# Patient Record
Sex: Female | Born: 1985
Health system: Southern US, Community
[De-identification: ages and names within clinical notes are randomized; demographics above are authoritative.]

## PROBLEM LIST (undated history)

## (undated) ENCOUNTER — Inpatient Hospital Stay (HOSPITAL_COMMUNITY): Payer: Self-pay

## (undated) DIAGNOSIS — R87629 Unspecified abnormal cytological findings in specimens from vagina: Secondary | ICD-10-CM

## (undated) DIAGNOSIS — F419 Anxiety disorder, unspecified: Secondary | ICD-10-CM

## (undated) HISTORY — PX: NO PAST SURGERIES: SHX2092

## (undated) HISTORY — PX: INDUCED ABORTION: SHX677

---
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
## 2005-09-03 ENCOUNTER — Emergency Department (HOSPITAL_COMMUNITY): Admission: EM | Admit: 2005-09-03 | Discharge: 2005-09-03 | Payer: Self-pay | Admitting: Family Medicine

## 2005-09-06 ENCOUNTER — Emergency Department (HOSPITAL_COMMUNITY): Admission: EM | Admit: 2005-09-06 | Discharge: 2005-09-06 | Payer: Self-pay | Admitting: Family Medicine

## 2013-04-30 ENCOUNTER — Other Ambulatory Visit: Payer: Self-pay | Admitting: Certified Nurse Midwife

## 2013-04-30 ENCOUNTER — Inpatient Hospital Stay (HOSPITAL_COMMUNITY)
Admission: AD | Admit: 2013-04-30 | Discharge: 2013-04-30 | Discharge status: Against Medical Advice | Payer: BC Managed Care – PPO | Source: Ambulatory Visit | Attending: Obstetrics | Admitting: Obstetrics

## 2013-04-30 DIAGNOSIS — O039 Complete or unspecified spontaneous abortion without complication: Secondary | ICD-10-CM

## 2013-04-30 MED ORDER — RHO D IMMUNE GLOBULIN 300 MCG IM INJ: 300.0000 ug | INJECTION | Freq: Once | INTRAMUSCULAR | Status: DC

## 2013-04-30 NOTE — ED Notes (Signed)
Pt only for lab work did not need to be on MAU census.

## 2015-01-20 ENCOUNTER — Inpatient Hospital Stay (HOSPITAL_COMMUNITY)
Admission: AD | Admit: 2015-01-20 | Discharge: 2015-01-20 | Disposition: A | Discharge status: Home / Self Care | Payer: Self-pay | Source: Ambulatory Visit | Attending: Family Medicine | Admitting: Family Medicine

## 2015-01-20 ENCOUNTER — Inpatient Hospital Stay (HOSPITAL_COMMUNITY): Payer: Self-pay

## 2015-01-20 ENCOUNTER — Encounter (HOSPITAL_COMMUNITY): Payer: Self-pay | Admitting: *Deleted

## 2015-01-20 DIAGNOSIS — O034 Incomplete spontaneous abortion without complication: Secondary | ICD-10-CM

## 2015-01-20 DIAGNOSIS — O209 Hemorrhage in early pregnancy, unspecified: Secondary | ICD-10-CM

## 2015-01-20 DIAGNOSIS — O039 Complete or unspecified spontaneous abortion without complication: Secondary | ICD-10-CM | POA: Insufficient documentation

## 2015-01-20 DIAGNOSIS — Z3A12 12 weeks gestation of pregnancy: Secondary | ICD-10-CM | POA: Insufficient documentation

## 2015-01-20 HISTORY — DX: Anxiety disorder, unspecified: F41.9

## 2015-01-20 LAB — CBC
HCT: 39.4 % (ref 36.0–46.0)
Hemoglobin: 13.8 g/dL (ref 12.0–15.0)
MCH: 29.9 pg (ref 26.0–34.0)
MCHC: 35 g/dL (ref 30.0–36.0)
MCV: 85.5 fL (ref 78.0–100.0)
Platelets: 359 10*3/uL (ref 150–400)
RBC: 4.61 MIL/uL (ref 3.87–5.11)
RDW: 14.2 % (ref 11.5–15.5)
WBC: 14.9 10*3/uL — ABNORMAL HIGH (ref 4.0–10.5)

## 2015-01-20 LAB — URINE MICROSCOPIC-ADD ON

## 2015-01-20 LAB — URINALYSIS, ROUTINE W REFLEX MICROSCOPIC
Bilirubin Urine: NEGATIVE
GLUCOSE, UA: NEGATIVE mg/dL
Ketones, ur: NEGATIVE mg/dL
Leukocytes, UA: NEGATIVE
Nitrite: NEGATIVE
PH: 6 (ref 5.0–8.0)
Protein, ur: NEGATIVE mg/dL
Specific Gravity, Urine: 1.01 (ref 1.005–1.030)
Urobilinogen, UA: 0.2 mg/dL (ref 0.0–1.0)

## 2015-01-20 LAB — ABO/RH: ABO/RH(D): A POS

## 2015-01-20 LAB — POCT PREGNANCY, URINE: Preg Test, Ur: POSITIVE — AB

## 2015-01-20 LAB — HCG, QUANTITATIVE, PREGNANCY: hCG, Beta Chain, Quant, S: 2464 m[IU]/mL — ABNORMAL HIGH (ref <5)

## 2015-01-20 MED ORDER — NAPROXEN SODIUM 275 MG PO TABS: 275.0000 mg | ORAL_TABLET | Freq: Two times a day (BID) | ORAL | Status: DC

## 2015-01-20 NOTE — MAU Note (Signed)
Thinks she is 12wks preg., thinks she may be having a miscarriage.  Had cramps last night.  Is having some brownish bleeding 2 days ago.  This morning when voided the water was red, since then has seen dark red or brown when she wipes

## 2015-01-20 NOTE — MAU Note (Signed)
Spoke with Madison Bowman CNM patient will be assigned to [facility] not [facility]

## 2015-01-20 NOTE — MAU Provider Note (Signed)
History     CSN: [id number]  Arrival date and time: 01/20/15 1203   First Provider Initiated Contact with Patient 01/20/15 1309      Chief Complaint  Patient presents with  . Threatened Miscarriage   HPI  Madison Bowman 29 y.o. G2P0010 [redacted]w[redacted]d presents  To the MAU stating that she has had some red to brown spotting over the past week. Occasional abdominal crampiness but not all the time.   Past Medical History  Diagnosis Date  . Anxiety     Past Surgical History  Procedure Laterality Date  . No past surgeries      No family history on file.  Social History  Substance Use Topics  . Smoking status: Current Every Day Smoker -- 0.50 packs/day    Types: Cigarettes  . Smokeless tobacco: None  . Alcohol Use: No    Allergies: No Known Allergies  Prescriptions prior to admission  Medication Sig Dispense Refill Last Dose  . OVER THE COUNTER MEDICATION Take 6 tablets by mouth daily. Juice plus herbal supplement   01/19/2015 at Unknown time  . Prenatal Vit-Fe Fumarate-FA (PRENATAL MULTIVITAMIN) TABS tablet Take 1 tablet by mouth at bedtime.   01/19/2015 at Unknown time  . sertraline (ZOLOFT) 100 MG tablet Take 200 mg by mouth daily.   01/19/2015 at Unknown time  . rho,D, immune globulin (RHOGAM ULTRA-FILTERED PLUS) 300 MCG INJ injection Inject 300 mcg into the muscle once. 1 each 0     Review of Systems  Constitutional: Negative for fever.  Gastrointestinal: Positive for abdominal pain.  Genitourinary:       Vag bleeding  All other systems reviewed and are negative.  Physical Exam   Blood pressure 116/84, pulse 88, temperature 99.2 F (37.3 C), temperature source Oral, resp. rate 16, weight 65.499 kg (144 lb 6.4 oz), last menstrual period 10/28/2014, unknown if currently breastfeeding.  Physical Exam  Nursing note and vitals reviewed. Constitutional: She is oriented to person, place, and time. She appears well-developed and well-nourished. No distress.  HENT:  Head:  Normocephalic and atraumatic.  Neck: Normal range of motion.  Cardiovascular: Normal rate.   Respiratory: Effort normal. No respiratory distress.  GI: Soft. There is no tenderness.  Musculoskeletal: Normal range of motion.  Neurological: She is alert and oriented to person, place, and time.  Skin: Skin is warm and dry.  Psychiatric: She has a normal mood and affect. Her behavior is normal. Judgment and thought content normal.   Results for orders placed or performed during the hospital encounter of 01/20/15 (from the past 24 hour(s))  Urinalysis, Routine w reflex microscopic (not at [facility])     Status: Abnormal   Collection Time: 01/20/15 12:35 PM  Result Value Ref Range   Color, Urine YELLOW YELLOW   APPearance CLEAR CLEAR   Specific Gravity, Urine 1.010 1.005 - 1.030   pH 6.0 5.0 - 8.0   Glucose, UA NEGATIVE NEGATIVE mg/dL   Hgb urine dipstick MODERATE (A) NEGATIVE   Bilirubin Urine NEGATIVE NEGATIVE   Ketones, ur NEGATIVE NEGATIVE mg/dL   Protein, ur NEGATIVE NEGATIVE mg/dL   Urobilinogen, UA 0.2 0.0 - 1.0 mg/dL   Nitrite NEGATIVE NEGATIVE   Leukocytes, UA NEGATIVE NEGATIVE  Urine microscopic-add on     Status: None   Collection Time: 01/20/15 12:35 PM  Result Value Ref Range   Squamous Epithelial / LPF RARE RARE   WBC, UA 0-2 <3 WBC/hpf   RBC / HPF 3-6 <3 RBC/hpf   Bacteria,  UA RARE RARE  CBC     Status: Abnormal   Collection Time: 01/20/15 12:58 PM  Result Value Ref Range   WBC 14.9 (H) 4.0 - 10.5 K/uL   RBC 4.61 3.87 - 5.11 MIL/uL   Hemoglobin 13.8 12.0 - 15.0 g/dL   HCT 16.1 09.6 - 04.5 %   MCV 85.5 78.0 - 100.0 fL   MCH 29.9 26.0 - 34.0 pg   MCHC 35.0 30.0 - 36.0 g/dL   RDW 40.9 81.1 - 91.4 %   Platelets 359 150 - 400 K/uL  Pregnancy, urine POC     Status: Abnormal   Collection Time: 01/20/15  1:02 PM  Result Value Ref Range   Preg Test, Ur POSITIVE (A) NEGATIVE  hCG, quantitative, pregnancy     Status: Abnormal   Collection Time: 01/20/15  2:13 PM  Result  Value Ref Range   hCG, Beta Chain, Quant, S 2464 (H) <5 mIU/mL  ABO/Rh     Status: None   Collection Time: 01/20/15  2:13 PM  Result Value Ref Range   ABO/RH(D) A POS   US Ob Comp Less 14 Wks  01/20/2015   CLINICAL DATA:  First trimester pregnancy, vaginal bleeding.  EXAM: OBSTETRIC <14 WK [place] AND TRANSVAGINAL OB US  TECHNIQUE: Both transabdominal and transvaginal ultrasound examinations were performed for complete evaluation of the gestation as well as the maternal uterus, adnexal regions, and pelvic cul-de-sac. Transvaginal technique was performed to assess early pregnancy.  COMPARISON:  None.  FINDINGS: Intrauterine gestational sac: Visualized.  Yolk sac:  Visualized.  Embryo:  Visualized.  Cardiac Activity: Not visualized.  CRL:  21.3  mm   8 w   5 d                  [place] EDC: August 27, 2015.  Maternal uterus/adnexae: Ovaries appear normal. No free fluid is noted.  IMPRESSION: Findings meet definitive criteria for failed pregnancy. This follows SRU consensus guidelines: Diagnostic Criteria for Nonviable Pregnancy Early in the First Trimester. [facility] Mis J Med [phone].   Electronically Signed   By: Lupita Raider, M.D.   On: 01/20/2015 15:30   US Ob Transvaginal  01/20/2015   CLINICAL DATA:  First trimester pregnancy, vaginal bleeding.  EXAM: OBSTETRIC <14 WK [place] AND TRANSVAGINAL OB US  TECHNIQUE: Both transabdominal and transvaginal ultrasound examinations were performed for complete evaluation of the gestation as well as the maternal uterus, adnexal regions, and pelvic cul-de-sac. Transvaginal technique was performed to assess early pregnancy.  COMPARISON:  None.  FINDINGS: Intrauterine gestational sac: Visualized.  Yolk sac:  Visualized.  Embryo:  Visualized.  Cardiac Activity: Not visualized.  CRL:  21.3  mm   8 w   5 d                  [place] EDC: August 27, 2015.  Maternal uterus/adnexae: Ovaries appear normal. No free fluid is noted.  IMPRESSION: Findings meet definitive criteria for failed  pregnancy. This follows SRU consensus guidelines: Diagnostic Criteria for Nonviable Pregnancy Early in the First Trimester. Macy Mis J Med [id number].   Electronically Signed   By: Lupita Raider, M.D.   On: 01/20/2015 15:30   MAU Course  Procedures  MDM CBC, U/S, Beta , ABO- Discussed failed pregnancy with pt and she chooses to proceed with expectant management. She states that she is already cramping some and she thinks she can mange her pain with Anaprox. Pt will be scheduled to f/u  in clinic on 01/26/15.  Assessment and Plan  Failed Pregnancy Anaprox DS Miscarriage/ Bleeding Precautions Discharge to home    Clemmons,Lori Grissett 01/20/2015, 2:28 PM

## 2015-01-20 NOTE — Discharge Instructions (Signed)
Miscarriage °A miscarriage is the loss of an unborn baby (fetus) before the 20th week of pregnancy. The cause is often unknown.  °HOME CARE °· You may need to stay in bed (bed rest), or you may be able to do light activity. Go about activity as told by your doctor. °· Have help at home. °· Write down how many pads you use each day. Write down how soaked they are. °· Do not use tampons. Do not wash out your vagina (douche) or have sex (intercourse) until your doctor approves. °· Only take medicine as told by your doctor. °· Do not take aspirin. °· Keep all doctor visits as told. °· If you or your partner have problems with grieving, talk to your doctor. You can also try counseling. Give yourself time to grieve before trying to get pregnant again. °GET HELP RIGHT AWAY IF: °· You have bad cramps or pain in your back or belly (abdomen). °· You have a fever. °· You pass large clumps of blood (clots) from your vagina that are walnut-sized or larger. Save the clumps for your doctor to see. °· You pass large amounts of tissue from your vagina. Save the tissue for your doctor to see. °· You have more bleeding. °· You have thick, bad-smelling fluid (discharge) coming from the vagina. °· You get lightheaded, weak, or you pass out (faint). °· You have chills. °MAKE SURE YOU: °· Understand these instructions. °· Will watch your condition. °· Will get help right away if you are not doing well or get worse. °Document Released: 07/08/2011 Document Reviewed: 07/08/2011 °ExitCare® Patient Information ©2015 [employer]. This information is not intended to replace advice given to you by your health care provider. Make sure you discuss any questions you have with your health care provider. ° °

## 2015-01-20 NOTE — MAU Note (Signed)
Called ultrasound inquiring how much longer for patient to go will probably be another 20 minutes, informed patient.

## 2015-01-26 ENCOUNTER — Encounter: Payer: Self-pay | Admitting: Medical

## 2015-01-26 ENCOUNTER — Ambulatory Visit (INDEPENDENT_AMBULATORY_CARE_PROVIDER_SITE_OTHER): Payer: Self-pay | Admitting: Medical

## 2015-01-26 ENCOUNTER — Inpatient Hospital Stay (HOSPITAL_COMMUNITY)
Admission: AD | Admit: 2015-01-26 | Discharge: 2015-01-26 | Disposition: A | Discharge status: Home / Self Care | Payer: Self-pay | Source: Ambulatory Visit | Attending: Obstetrics & Gynecology | Admitting: Obstetrics & Gynecology

## 2015-01-26 VITALS — BP 104/71 | HR 71 | Temp 98.4°F | Ht 61.0 in | Wt 142.3 lb

## 2015-01-26 DIAGNOSIS — O039 Complete or unspecified spontaneous abortion without complication: Secondary | ICD-10-CM

## 2015-01-26 DIAGNOSIS — Z3A08 8 weeks gestation of pregnancy: Secondary | ICD-10-CM | POA: Insufficient documentation

## 2015-01-26 DIAGNOSIS — O209 Hemorrhage in early pregnancy, unspecified: Secondary | ICD-10-CM | POA: Insufficient documentation

## 2015-01-26 LAB — CBC
HEMATOCRIT: 39.4 % (ref 36.0–46.0)
Hemoglobin: 13.3 g/dL (ref 12.0–15.0)
MCH: 29.4 pg (ref 26.0–34.0)
MCHC: 33.8 g/dL (ref 30.0–36.0)
MCV: 87.2 fL (ref 78.0–100.0)
MPV: 10.1 fL (ref 8.6–12.4)
PLATELETS: 434 10*3/uL — AB (ref 150–400)
RBC: 4.52 MIL/uL (ref 3.87–5.11)
RDW: 14.1 % (ref 11.5–15.5)
WBC: 12.5 10*3/uL — AB (ref 4.0–10.5)

## 2015-01-26 NOTE — Patient Instructions (Signed)
Miscarriage °A miscarriage is the loss of an unborn baby (fetus) before the 20th week of pregnancy. The cause is often unknown.  °HOME CARE °· You may need to stay in bed (bed rest), or you may be able to do light activity. Go about activity as told by your doctor. °· Have help at home. °· Write down how many pads you use each day. Write down how soaked they are. °· Do not use tampons. Do not wash out your vagina (douche) or have sex (intercourse) until your doctor approves. °· Only take medicine as told by your doctor. °· Do not take aspirin. °· Keep all doctor visits as told. °· If you or your partner have problems with grieving, talk to your doctor. You can also try counseling. Give yourself time to grieve before trying to get pregnant again. °GET HELP RIGHT AWAY IF: °· You have bad cramps or pain in your back or belly (abdomen). °· You have a fever. °· You pass large clumps of blood (clots) from your vagina that are walnut-sized or larger. Save the clumps for your doctor to see. °· You pass large amounts of tissue from your vagina. Save the tissue for your doctor to see. °· You have more bleeding. °· You have thick, bad-smelling fluid (discharge) coming from the vagina. °· You get lightheaded, weak, or you pass out (faint). °· You have chills. °MAKE SURE YOU: °· Understand these instructions. °· Will watch your condition. °· Will get help right away if you are not doing well or get worse. °Document Released: 07/08/2011 Document Reviewed: 07/08/2011 °ExitCare® Patient Information ©2015 [employer]. This information is not intended to replace advice given to you by your health care Guida Asman. Make sure you discuss any questions you have with your health care Nassim Cosma. ° °

## 2015-01-26 NOTE — Progress Notes (Signed)
Patient ID: [name], female   DOB: 08-Oct-1985, 29 y.o.   MRN: [id number]  History:  Ms. Madison Bowman is a 29 y.o. G2P0020 who presents to clinic today for follow-up after missed AB. The patient was diagnosed with missed AB at [redacted]w[redacted]d last Friday. She had decided on expectant management at this time. She states that over last weekend she had heavy bleeding with severe cramping and passed clots and tissue. She states that bleeding now is much lighter and the pain is mild and intermittent. She is not taking any pain medication. She did try Anaprox initially for pain without relief. This is the patient's second miscarriage around the same GA. She denies fever.     There are no active problems to display for this patient.   No Known Allergies  No current outpatient prescriptions on file prior to visit.   No current facility-administered medications on file prior to visit.     The following portions of the patient's history were reviewed and updated as appropriate: allergies, current medications, family history, past medical history, social history, past surgical history and problem list.  Review of Systems:  Other than those mentioned in HPI all ROS negative  Objective:  Physical Exam BP 104/71 mmHg  Pulse 71  Temp(Src) 98.4 F (36.9 C) (Oral)  Ht  (1.549 m)  Wt 142 lb 4.8 oz (64.547 kg)  BMI 26.90 kg/m2  LMP 10/28/2014 (Exact Date)  Breastfeeding? Unknown CONSTITUTIONAL: Well-developed, well-nourished female in no acute distress.  EYES: EOM intact, conjunctivae normal, no scleral icterus HEAD: Normocephalic, atraumatic ENT: External right and left ear normal, oropharynx is clear and moist. CARDIOVASCULAR: Normal heart rate noted RESPIRATORY: Effort normal, no problems with respiration noted. GASTROINTESTINAL:Soft, no distention noted.   MUSCULOSKELETAL: Normal range of motion.  SKIN: Skin is warm and dry. No rash noted. Not diaphoretic. No erythema. No pallor. NEUROLGIC:  Alert and oriented to person, place, and time. Normal muscle tone, coordination.  PSYCHIATRIC: Normal mood and affect. Normal behavior. Normal judgment and thought content.  Labs and Imaging Quant hCG and CBC today  Assessment & Plan:  Assessment: SAB  Plans: Patient will be contacted with hCG results and may need to return for follow-up if hCG is still elevated Patient may desire further testing for habitual abortions even though this is her second SAB. She will discuss with her family and return for labs if desired Patient to return to [facility] as needed or if symptoms were to change or worsen  Marny Lowenstein, PA-C 01/26/2015 1:51 PM

## 2015-01-27 LAB — HCG, QUANTITATIVE, PREGNANCY: hCG, Beta Chain, Quant, S: 85.8 m[IU]/mL

## 2015-01-30 ENCOUNTER — Telehealth: Payer: Self-pay | Admitting: *Deleted

## 2015-01-30 NOTE — Telephone Encounter (Signed)
Pt called into the clinic, results given.  Pt states she can come Thursday 02/02/15 @ 1100 , message sent to front office.

## 2015-01-30 NOTE — Telephone Encounter (Signed)
Attempted to contact patient with BHCG results and recommendation to return to clinic on Thurs. For repeat lab, no answer, left message for patient to return call to clinic for results.

## 2015-02-02 ENCOUNTER — Other Ambulatory Visit: Payer: Self-pay

## 2015-02-02 DIAGNOSIS — O021 Missed abortion: Secondary | ICD-10-CM

## 2015-02-03 LAB — HCG, QUANTITATIVE, PREGNANCY: hCG, Beta Chain, Quant, S: 16.9 m[IU]/mL

## 2015-05-25 ENCOUNTER — Ambulatory Visit (INDEPENDENT_AMBULATORY_CARE_PROVIDER_SITE_OTHER): Payer: BLUE CROSS/BLUE SHIELD | Admitting: Medical

## 2015-05-25 ENCOUNTER — Encounter: Payer: Self-pay | Admitting: Medical

## 2015-05-25 ENCOUNTER — Telehealth: Payer: Self-pay | Admitting: General Practice

## 2015-05-25 DIAGNOSIS — Z36 Encounter for antenatal screening of mother: Secondary | ICD-10-CM | POA: Diagnosis not present

## 2015-05-25 DIAGNOSIS — O3680X0 Pregnancy with inconclusive fetal viability, not applicable or unspecified: Secondary | ICD-10-CM | POA: Diagnosis not present

## 2015-05-25 DIAGNOSIS — Z3201 Encounter for pregnancy test, result positive: Secondary | ICD-10-CM

## 2015-05-25 DIAGNOSIS — O3680X1 Pregnancy with inconclusive fetal viability, fetus 1: Secondary | ICD-10-CM

## 2015-05-25 LAB — POCT PREGNANCY, URINE: PREG TEST UR: POSITIVE — AB

## 2015-05-25 NOTE — Telephone Encounter (Signed)
Received call from ultrasound that time slot was not able for previously scheduled ultrasound & appt was moved to 2/2 @ 11am. Called patient to informed her. Patient states she cannot do that time but could earlier. Rescheduled ultrasound for 2/2 @ 8am. Patient informed. Patient verbalized understanding & had no questions

## 2015-05-25 NOTE — Progress Notes (Signed)
Bedside US for viability and EGA.  Single IUP, FHR - 183 bpm per M-mode, CRL - 2.22 cm (8w 6d).

## 2015-05-25 NOTE — Progress Notes (Signed)
Patient here today to have quant checked. Patient states she has had multiple miscarriages in a row and was told she would need follow up at her next pregnancy because she always loses babies at 8 weeks. Patient reports positive home test last week or the week before. Patient unsure of LMP. States she had a period sometime in November but it was not like a normal period so she doesn't know if that is what it was or not. Upt positive in office today. Diane Day to scan patient. Vonzella Nipple informed. Clinic ultrasound reveals [redacted]w[redacted]d by crl with edd 12/29/15. Dating ultrasound scheduled for 2/1 @ 1130. Patient informed and encouraged her to make new OB for 4 weeks from now. Patient verbalized understanding to all & had no questions

## 2015-05-25 NOTE — Patient Instructions (Signed)

## 2015-05-25 NOTE — Progress Notes (Signed)
Patient ID: [name], female   DOB: 1985/11/12, 30 y.o.   MRN: [id number]  Patient not seen today. See RN note for more information.   Marny Lowenstein, PA-C 05/25/2015 3:32 PM

## 2015-05-25 NOTE — Addendum Note (Signed)
Addended by: Jill Side on: 05/25/2015 03:37 PM   Modules accepted: Orders

## 2015-05-31 ENCOUNTER — Ambulatory Visit (HOSPITAL_COMMUNITY): Payer: BLUE CROSS/BLUE SHIELD

## 2015-06-01 ENCOUNTER — Ambulatory Visit (INDEPENDENT_AMBULATORY_CARE_PROVIDER_SITE_OTHER): Payer: BLUE CROSS/BLUE SHIELD | Admitting: Obstetrics & Gynecology

## 2015-06-01 ENCOUNTER — Ambulatory Visit (HOSPITAL_COMMUNITY): Payer: BLUE CROSS/BLUE SHIELD

## 2015-06-01 ENCOUNTER — Encounter: Payer: BLUE CROSS/BLUE SHIELD | Admitting: Obstetrics & Gynecology

## 2015-06-01 ENCOUNTER — Other Ambulatory Visit: Payer: Self-pay | Admitting: General Practice

## 2015-06-01 ENCOUNTER — Ambulatory Visit (HOSPITAL_COMMUNITY)
Admission: RE | Admit: 2015-06-01 | Discharge: 2015-06-01 | Disposition: A | Discharge status: Home / Self Care | Payer: BLUE CROSS/BLUE SHIELD | Source: Ambulatory Visit | Attending: Medical | Admitting: Medical

## 2015-06-01 DIAGNOSIS — Z36 Encounter for antenatal screening of mother: Secondary | ICD-10-CM | POA: Diagnosis not present

## 2015-06-01 DIAGNOSIS — O0990 Supervision of high risk pregnancy, unspecified, unspecified trimester: Secondary | ICD-10-CM | POA: Insufficient documentation

## 2015-06-01 DIAGNOSIS — O2621 Pregnancy care for patient with recurrent pregnancy loss, first trimester: Secondary | ICD-10-CM

## 2015-06-01 DIAGNOSIS — N96 Recurrent pregnancy loss: Secondary | ICD-10-CM

## 2015-06-01 DIAGNOSIS — Z3201 Encounter for pregnancy test, result positive: Secondary | ICD-10-CM

## 2015-06-01 DIAGNOSIS — Z3A10 10 weeks gestation of pregnancy: Secondary | ICD-10-CM | POA: Insufficient documentation

## 2015-06-01 DIAGNOSIS — Z3491 Encounter for supervision of normal pregnancy, unspecified, first trimester: Secondary | ICD-10-CM

## 2015-06-01 NOTE — Patient Instructions (Signed)
First Trimester of Pregnancy The first trimester of pregnancy is from week 1 until the end of week 12 (months 1 through 3). A week after a sperm fertilizes an egg, the egg will implant on the wall of the uterus. This embryo will begin to develop into a baby. Genes from you and your partner are forming the baby. The female genes determine whether the baby is a boy or a girl. At 6-8 weeks, the eyes and face are formed, and the heartbeat can be seen on ultrasound. At the end of 12 weeks, all the baby's organs are formed.  Now that you are pregnant, you will want to do everything you can to have a healthy baby. Two of the most important things are to get good prenatal care and to follow your health care provider's instructions. Prenatal care is all the medical care you receive before the baby's birth. This care will help prevent, find, and treat any problems during the pregnancy and childbirth. BODY CHANGES Your body goes through many changes during pregnancy. The changes vary from woman to woman.   You may gain or lose a couple of pounds at first.  You may feel sick to your stomach (nauseous) and throw up (vomit). If the vomiting is uncontrollable, call your health care provider.  You may tire easily.  You may develop headaches that can be relieved by medicines approved by your health care provider.  You may urinate more often. Painful urination may mean you have a bladder infection.  You may develop heartburn as a result of your pregnancy.  You may develop constipation because certain hormones are causing the muscles that push waste through your intestines to slow down.  You may develop hemorrhoids or swollen, bulging veins (varicose veins).  Your breasts may begin to grow larger and become tender. Your nipples may stick out more, and the tissue that surrounds them (areola) may become darker.  Your gums may bleed and may be sensitive to brushing and flossing.  Dark spots or blotches (chloasma,  mask of pregnancy) may develop on your face. This will likely fade after the baby is born.  Your menstrual periods will stop.  You may have a loss of appetite.  You may develop cravings for certain kinds of food.  You may have changes in your emotions from day to day, such as being excited to be pregnant or being concerned that something may go wrong with the pregnancy and baby.  You may have more vivid and strange dreams.  You may have changes in your hair. These can include thickening of your hair, rapid growth, and changes in texture. Some women also have hair loss during or after pregnancy, or hair that feels dry or thin. Your hair will most likely return to normal after your baby is born. WHAT TO EXPECT AT YOUR PRENATAL VISITS During a routine prenatal visit:  You will be weighed to make sure you and the baby are growing normally.  Your blood pressure will be taken.  Your abdomen will be measured to track your baby's growth.  The fetal heartbeat will be listened to starting around week 10 or 12 of your pregnancy.  Test results from any previous visits will be discussed. Your health care provider may ask you:  How you are feeling.  If you are feeling the baby move.  If you have had any abnormal symptoms, such as leaking fluid, bleeding, severe headaches, or abdominal cramping.  If you are using any tobacco products,   including cigarettes, chewing tobacco, and electronic cigarettes.  If you have any questions. Other tests that may be performed during your first trimester include:  Blood tests to find your blood type and to check for the presence of any previous infections. They will also be used to check for low iron levels (anemia) and Rh antibodies. Later in the pregnancy, blood tests for diabetes will be done along with other tests if problems develop.  Urine tests to check for infections, diabetes, or protein in the urine.  An ultrasound to confirm the proper growth  and development of the baby.  An amniocentesis to check for possible genetic problems.  Fetal screens for spina bifida and Down syndrome.  You may need other tests to make sure you and the baby are doing well.  HIV (human immunodeficiency virus) testing. Routine prenatal testing includes screening for HIV, unless you choose not to have this test. HOME CARE INSTRUCTIONS  Medicines  Follow your health care provider's instructions regarding medicine use. Specific medicines may be either safe or unsafe to take during pregnancy.  Take your prenatal vitamins as directed.  If you develop constipation, try taking a stool softener if your health care provider approves. Diet  Eat regular, well-balanced meals. Choose a variety of foods, such as meat or vegetable-based protein, fish, milk and low-fat dairy products, vegetables, fruits, and whole grain breads and cereals. Your health care provider will help you determine the amount of weight gain that is right for you.  Avoid raw meat and uncooked cheese. These carry germs that can cause birth defects in the baby.  Eating four or five small meals rather than three large meals a day may help relieve nausea and vomiting. If you start to feel nauseous, eating a few soda crackers can be helpful. Drinking liquids between meals instead of during meals also seems to help nausea and vomiting.  If you develop constipation, eat more high-fiber foods, such as fresh vegetables or fruit and whole grains. Drink enough fluids to keep your urine clear or pale yellow. Activity and Exercise  Exercise only as directed by your health care provider. Exercising will help you:  Control your weight.  Stay in shape.  Be prepared for labor and delivery.  Experiencing pain or cramping in the lower abdomen or low back is a good sign that you should stop exercising. Check with your health care provider before continuing normal exercises.  Try to avoid standing for long  periods of time. Move your legs often if you must stand in one place for a long time.  Avoid heavy lifting.  Wear low-heeled shoes, and practice good posture.  You may continue to have sex unless your health care provider directs you otherwise. Relief of Pain or Discomfort  Wear a good support bra for breast tenderness.   Take warm sitz baths to soothe any pain or discomfort caused by hemorrhoids. Use hemorrhoid cream if your health care provider approves.   Rest with your legs elevated if you have leg cramps or low back pain.  If you develop varicose veins in your legs, wear support hose. Elevate your feet for 15 minutes, 3-4 times a day. Limit salt in your diet. Prenatal Care  Schedule your prenatal visits by the twelfth week of pregnancy. They are usually scheduled monthly at first, then more often in the last 2 months before delivery.  Write down your questions. Take them to your prenatal visits.  Keep all your prenatal visits as directed by your   health care provider. Safety  Wear your seat belt at all times when driving.  Make a list of emergency phone numbers, including numbers for family, friends, the hospital, and police and fire departments. General Tips  Ask your health care provider for a referral to a local prenatal education class. Begin classes no later than at the beginning of month 6 of your pregnancy.  Ask for help if you have counseling or nutritional needs during pregnancy. Your health care provider can offer advice or refer you to specialists for help with various needs.  Do not use hot tubs, steam rooms, or saunas.  Do not douche or use tampons or scented sanitary pads.  Do not cross your legs for long periods of time.  Avoid cat litter boxes and soil used by cats. These carry germs that can cause birth defects in the baby and possibly loss of the fetus by miscarriage or stillbirth.  Avoid all smoking, herbs, alcohol, and medicines not prescribed by  your health care provider. Chemicals in these affect the formation and growth of the baby.  Do not use any tobacco products, including cigarettes, chewing tobacco, and electronic cigarettes. If you need help quitting, ask your health care provider. You may receive counseling support and other resources to help you quit.  Schedule a dentist appointment. At home, brush your teeth with a soft toothbrush and be gentle when you floss. SEEK MEDICAL CARE IF:   You have dizziness.  You have mild pelvic cramps, pelvic pressure, or nagging pain in the abdominal area.  You have persistent nausea, vomiting, or diarrhea.  You have a bad smelling vaginal discharge.  You have pain with urination.  You notice increased swelling in your face, hands, legs, or ankles. SEEK IMMEDIATE MEDICAL CARE IF:   You have a fever.  You are leaking fluid from your vagina.  You have spotting or bleeding from your vagina.  You have severe abdominal cramping or pain.  You have rapid weight gain or loss.  You vomit blood or material that looks like coffee grounds.  You are exposed to German measles and have never had them.  You are exposed to fifth disease or chickenpox.  You develop a severe headache.  You have shortness of breath.  You have any kind of trauma, such as from a fall or a car accident.   This information is not intended to replace advice given to you by your health care provider. Make sure you discuss any questions you have with your health care provider.   Document Released: 04/09/2001 Document Revised: 05/06/2014 Document Reviewed: 02/23/2013 Elsevier Interactive Patient Education 2016 [employer]  

## 2015-06-19 ENCOUNTER — Ambulatory Visit (HOSPITAL_COMMUNITY)
Admission: RE | Admit: 2015-06-19 | Discharge: 2015-06-19 | Disposition: A | Discharge status: Home / Self Care | Payer: BLUE CROSS/BLUE SHIELD | Source: Ambulatory Visit | Attending: Obstetrics & Gynecology | Admitting: Obstetrics & Gynecology

## 2015-06-19 ENCOUNTER — Other Ambulatory Visit: Payer: Self-pay | Admitting: Obstetrics & Gynecology

## 2015-06-19 ENCOUNTER — Encounter (HOSPITAL_COMMUNITY): Payer: Self-pay

## 2015-06-19 DIAGNOSIS — Z369 Encounter for antenatal screening, unspecified: Secondary | ICD-10-CM

## 2015-06-19 DIAGNOSIS — Z3491 Encounter for supervision of normal pregnancy, unspecified, first trimester: Secondary | ICD-10-CM

## 2015-06-19 DIAGNOSIS — Z3A12 12 weeks gestation of pregnancy: Secondary | ICD-10-CM

## 2015-06-19 DIAGNOSIS — Z36 Encounter for antenatal screening of mother: Secondary | ICD-10-CM | POA: Diagnosis not present

## 2015-06-21 ENCOUNTER — Encounter: Payer: BLUE CROSS/BLUE SHIELD | Admitting: Advanced Practice Midwife

## 2015-06-22 ENCOUNTER — Encounter: Payer: BLUE CROSS/BLUE SHIELD | Admitting: Obstetrics & Gynecology

## 2015-06-27 ENCOUNTER — Encounter: Payer: Self-pay | Admitting: *Deleted

## 2015-06-27 DIAGNOSIS — Z3490 Encounter for supervision of normal pregnancy, unspecified, unspecified trimester: Secondary | ICD-10-CM

## 2015-06-28 ENCOUNTER — Other Ambulatory Visit (HOSPITAL_COMMUNITY): Payer: Self-pay

## 2015-07-12 ENCOUNTER — Encounter: Payer: Self-pay | Admitting: Advanced Practice Midwife

## 2015-07-12 ENCOUNTER — Ambulatory Visit (INDEPENDENT_AMBULATORY_CARE_PROVIDER_SITE_OTHER): Payer: BLUE CROSS/BLUE SHIELD | Admitting: Advanced Practice Midwife

## 2015-07-12 VITALS — BP 115/61 | HR 63 | Temp 98.2°F | Wt 146.9 lb

## 2015-07-12 DIAGNOSIS — Z124 Encounter for screening for malignant neoplasm of cervix: Secondary | ICD-10-CM | POA: Diagnosis not present

## 2015-07-12 DIAGNOSIS — Z3491 Encounter for supervision of normal pregnancy, unspecified, first trimester: Secondary | ICD-10-CM

## 2015-07-12 DIAGNOSIS — Z113 Encounter for screening for infections with a predominantly sexual mode of transmission: Secondary | ICD-10-CM

## 2015-07-12 DIAGNOSIS — Z1389 Encounter for screening for other disorder: Secondary | ICD-10-CM

## 2015-07-12 DIAGNOSIS — F17200 Nicotine dependence, unspecified, uncomplicated: Secondary | ICD-10-CM | POA: Insufficient documentation

## 2015-07-12 DIAGNOSIS — O99331 Smoking (tobacco) complicating pregnancy, first trimester: Secondary | ICD-10-CM

## 2015-07-12 LAB — POCT URINALYSIS DIP (DEVICE)
BILIRUBIN URINE: NEGATIVE
GLUCOSE, UA: NEGATIVE mg/dL
HGB URINE DIPSTICK: NEGATIVE
Ketones, ur: NEGATIVE mg/dL
LEUKOCYTES UA: NEGATIVE
NITRITE: NEGATIVE
Protein, ur: NEGATIVE mg/dL
Specific Gravity, Urine: 1.01 (ref 1.005–1.030)
UROBILINOGEN UA: 0.2 mg/dL (ref 0.0–1.0)
pH: 5.5 (ref 5.0–8.0)

## 2015-07-12 MED ORDER — CONCEPT OB 130-92.4-1 MG PO CAPS: 1.0000 | ORAL_CAPSULE | Freq: Every day | ORAL | Status: DC

## 2015-07-12 NOTE — Progress Notes (Signed)
   Subjective:    Madison Bowman is a G4P0030 [id number] by  10 weeks US being seen today for her first obstetrical visit.  Her obstetrical history is significant for smoker. Patient does intend to breast feed. Pregnancy history fully reviewed.  Patient reports no complaints.  Filed Vitals:   07/12/15 1010  BP: 115/61  Pulse: 63  Temp: 98.2 F (36.8 C)  Weight: 146 lb 14.4 oz (66.633 kg)    HISTORY: OB History  Gravida Para Term Preterm AB SAB TAB Ectopic Multiple Living  4 0 0 0 3 2 1 0 0 0     # Outcome Date GA Lbr Len/2nd Weight Sex Delivery Anes PTL Lv  4 Current           3 SAB 2016          2 SAB 2015          1 TAB 2011            Obstetric Comments  Patient had TAB with Cytotec at age 30 years and her family/husband is unaware of this history    Past Medical History  Diagnosis Date  . Anxiety    Past Surgical History  Procedure Laterality Date  . No past surgeries     Family History  Problem Relation Age of Onset  . Thyroid disease Mother   . Depression Maternal Grandfather   . Heart disease Paternal Grandfather      Exam    Uterus: 16 week size  Pelvic Exam:    Perineum: No Hemorrhoids, Hemorrhoids, Normal Perineum   Vulva: normal   Vagina:  normal mucosa, normal discharge   pH: NA   Cervix: no cervical motion tenderness, nulliparous appearance and Scant bleeding after Pap   Adnexa: normal adnexa and no mass, fullness, tenderness   Bony Pelvis: average  System: Breast:  normal appearance, no masses or tenderness, No nipple retraction or dimpling, No nipple discharge or bleeding, No axillary or supraclavicular adenopathy   Skin: normal coloration and turgor, no rashes    Neurologic: oriented, normal mood, grossly non-focal   Extremities: normal strength, tone, and muscle mass   HEENT sclera clear, anicteric   Mouth/Teeth mucous membranes moist, pharynx normal without lesions and dental hygiene good   Neck supple and no masses   Cardiovascular: regular  rate and rhythm   Respiratory:  appears well, vitals normal, no respiratory distress, acyanotic, normal RR, chest clear, no wheezing, crepitations, rhonchi, normal symmetric air entry   Abdomen: soft, non-tender; bowel sounds normal; no masses,  no organomegaly   Urinary: urethral meatus normal      Assessment:    Pregnancy: G4P0030 1. Supervision of low-risk pregnancy, first trimester  - US MFM OB COMP + 14 WK; Future - Prenatal Profile - Cystic fibrosis diagnostic study - Cytology - PAP - GC[facility]Chlamydia probe amp [facility] - Prescript Monitor Profile(19) - Culture, OB Urine - Prenat w/o A Vit-FeFum-FePo-FA (CONCEPT OB) 130-92.4-1 MG CAPS; Take 1 tablet by mouth daily.  Dispense: 30 capsule; Refill: 12 - AFP  2. Encounter for routine screening for malformation using ultrasonics  - US MFM OB COMP + 14 WK; Future    Plan:     Initial labs drawn. Prenatal vitamins. Problem list reviewed and updated. Genetic Screening discussed First Screen: results reviewed. Ultrasound discussed; fetal survey: ordered. Follow up in 4 weeks.   Dorathy KinsmanSMITH, Dvon Jiles 07/12/2015

## 2015-07-12 NOTE — Patient Instructions (Addendum)
Second Trimester of Pregnancy The second trimester is from week 13 through week 28, months 4 through 6. The second trimester is often a time when you feel your best. Your body has also adjusted to being pregnant, and you begin to feel better physically. Usually, morning sickness has lessened or quit completely, you may have more energy, and you may have an increase in appetite. The second trimester is also a time when the fetus is growing rapidly. At the end of the sixth month, the fetus is about 9 inches long and weighs about 1 pounds. You will likely begin to feel the baby move (quickening) between 18 and 20 weeks of the pregnancy. BODY CHANGES Your body goes through many changes during pregnancy. The changes vary from woman to woman.   Your weight will continue to increase. You will notice your lower abdomen bulging out.  You may begin to get stretch marks on your hips, abdomen, and breasts.  You may develop headaches that can be relieved by medicines approved by your health care provider.  You may urinate more often because the fetus is pressing on your bladder.  You may develop or continue to have heartburn as a result of your pregnancy.  You may develop constipation because certain hormones are causing the muscles that push waste through your intestines to slow down.  You may develop hemorrhoids or swollen, bulging veins (varicose veins).  You may have back pain because of the weight gain and pregnancy hormones relaxing your joints between the bones in your pelvis and as a result of a shift in weight and the muscles that support your balance.  Your breasts will continue to grow and be tender.  Your gums may bleed and may be sensitive to brushing and flossing.  Dark spots or blotches (chloasma, mask of pregnancy) may develop on your face. This will likely fade after the baby is born.  A dark line from your belly button to the pubic area (linea nigra) may appear. This will likely fade  after the baby is born.  You may have changes in your hair. These can include thickening of your hair, rapid growth, and changes in texture. Some women also have hair loss during or after pregnancy, or hair that feels dry or thin. Your hair will most likely return to normal after your baby is born. WHAT TO EXPECT AT YOUR PRENATAL VISITS During a routine prenatal visit:  You will be weighed to make sure you and the fetus are growing normally.  Your blood pressure will be taken.  Your abdomen will be measured to track your baby's growth.  The fetal heartbeat will be listened to.  Any test results from the previous visit will be discussed. Your health care provider may ask you:  How you are feeling.  If you are feeling the baby move.  If you have had any abnormal symptoms, such as leaking fluid, bleeding, severe headaches, or abdominal cramping.  If you are using any tobacco products, including cigarettes, chewing tobacco, and electronic cigarettes.  If you have any questions. Other tests that may be performed during your second trimester include:  Blood tests that check for:  Low iron levels (anemia).  Gestational diabetes (between 24 and 28 weeks).  Rh antibodies.  Urine tests to check for infections, diabetes, or protein in the urine.  An ultrasound to confirm the proper growth and development of the baby.  An amniocentesis to check for possible genetic problems.  Fetal screens for spina bifida   and Down syndrome.  HIV (human immunodeficiency virus) testing. Routine prenatal testing includes screening for HIV, unless you choose not to have this test. HOME CARE INSTRUCTIONS   Avoid all smoking, herbs, alcohol, and unprescribed drugs. These chemicals affect the formation and growth of the baby.  Do not use any tobacco products, including cigarettes, chewing tobacco, and electronic cigarettes. If you need help quitting, ask your health care provider. You may receive  counseling support and other resources to help you quit.  Follow your health care provider's instructions regarding medicine use. There are medicines that are either safe or unsafe to take during pregnancy.  Exercise only as directed by your health care provider. Experiencing uterine cramps is a good sign to stop exercising.  Continue to eat regular, healthy meals.  Wear a good support bra for breast tenderness.  Do not use hot tubs, steam rooms, or saunas.  Wear your seat belt at all times when driving.  Avoid raw meat, uncooked cheese, cat litter boxes, and soil used by cats. These carry germs that can cause birth defects in the baby.  Take your prenatal vitamins.  Take 1500-2000 mg of calcium daily starting at the 20th week of pregnancy until you deliver your baby.  Try taking a stool softener (if your health care provider approves) if you develop constipation. Eat more high-fiber foods, such as fresh vegetables or fruit and whole grains. Drink plenty of fluids to keep your urine clear or pale yellow.  Take warm sitz baths to soothe any pain or discomfort caused by hemorrhoids. Use hemorrhoid cream if your health care provider approves.  If you develop varicose veins, wear support hose. Elevate your feet for 15 minutes, 3-4 times a day. Limit salt in your diet.  Avoid heavy lifting, wear low heel shoes, and practice good posture.  Rest with your legs elevated if you have leg cramps or low back pain.  Visit your dentist if you have not gone yet during your pregnancy. Use a soft toothbrush to brush your teeth and be gentle when you floss.  A sexual relationship may be continued unless your health care provider directs you otherwise.  Continue to go to all your prenatal visits as directed by your health care provider. SEEK MEDICAL CARE IF:   You have dizziness.  You have mild pelvic cramps, pelvic pressure, or nagging pain in the abdominal area.  You have persistent nausea,  vomiting, or diarrhea.  You have a bad smelling vaginal discharge.  You have pain with urination. SEEK IMMEDIATE MEDICAL CARE IF:   You have a fever.  You are leaking fluid from your vagina.  You have spotting or bleeding from your vagina.  You have severe abdominal cramping or pain.  You have rapid weight gain or loss.  You have shortness of breath with chest pain.  You notice sudden or extreme swelling of your face, hands, ankles, feet, or legs.  You have not felt your baby move in over an hour.  You have severe headaches that do not go away with medicine.  You have vision changes.   This information is not intended to replace advice given to you by your health care provider. Make sure you discuss any questions you have with your health care provider.   Document Released: 04/09/2001 Document Revised: 05/06/2014 Document Reviewed: 06/16/2012 Elsevier Interactive Patient Education 2016 Else[employer] Inc[employer] Smoking Cessation, Tips for Success If you are ready to quit smoking, congratulations! You have chosen to help yourself be healthier. Cigarettes   Cigarettes bring nicotine, tar, carbon monoxide, and other irritants into your body. Your lungs, heart, and blood vessels will be able to work better without these poisons. There are many different ways to quit smoking. Nicotine gum, nicotine patches, a nicotine inhaler, or nicotine nasal spray can help with physical craving. Hypnosis, support groups, and medicines help break the habit of smoking. WHAT THINGS CAN I DO TO MAKE QUITTING EASIER?  Here are some tips to help you quit for good:  Pick a date when you will quit smoking completely. Tell all of your friends and family about your plan to quit on that date.  Do not try to slowly cut down on the number of cigarettes you are smoking. Pick a quit date and quit smoking completely starting on that day.  Throw away all cigarettes.   Clean and remove all ashtrays from your home, work, and  car.  On a card, write down your reasons for quitting. Carry the card with you and read it when you get the urge to smoke.  Cleanse your body of nicotine. Drink enough water and fluids to keep your urine clear or pale yellow. Do this after quitting to flush the nicotine from your body.  Learn to predict your moods. Do not let a bad situation be your excuse to have a cigarette. Some situations in your life might tempt you into wanting a cigarette.  Never have "just one" cigarette. It leads to wanting another and another. Remind yourself of your decision to quit.  Change habits associated with smoking. If you smoked while driving or when feeling stressed, try other activities to replace smoking. Stand up when drinking your coffee. Brush your teeth after eating. Sit in a different chair when you read the paper. Avoid alcohol while trying to quit, and try to drink fewer caffeinated beverages. Alcohol and caffeine may urge you to smoke.  Avoid foods and drinks that can trigger a desire to smoke, such as sugary or spicy foods and alcohol.  Ask people who smoke not to smoke around you.  Have something planned to do right after eating or having a cup of coffee. For example, plan to take a walk or exercise.  Try a relaxation exercise to calm you down and decrease your stress. Remember, you may be tense and nervous for the first 2 weeks after you quit, but this will pass.  Find new activities to keep your hands busy. Play with a pen, coin, or rubber band. Doodle or draw things on paper.  Brush your teeth right after eating. This will help cut down on the craving for the taste of tobacco after meals. You can also try mouthwash.   Use oral substitutes in place of cigarettes. Try using lemon drops, carrots, cinnamon sticks, or chewing gum. Keep them handy so they are available when you have the urge to smoke.  When you have the urge to smoke, try deep breathing.  Designate your home as a nonsmoking  area.  If you are a heavy smoker, ask your health care provider about a prescription for nicotine chewing gum. It can ease your withdrawal from nicotine.  Reward yourself. Set aside the cigarette money you save and buy yourself something nice.  Look for support from others. Join a support group or smoking cessation program. Ask someone at home or at work to help you with your plan to quit smoking.  Always ask yourself, "Do I need this cigarette or is this just a reflex?" Tell yourself, "  I choose not to smoke," or "I do not want to smoke." You are reminding yourself of your decision to quit.  Do not replace cigarette smoking with electronic cigarettes (commonly called e-cigarettes). The safety of e-cigarettes is unknown, and some may contain harmful chemicals.  If you relapse, do not give up! Plan ahead and think about what you will do the next time you get the urge to smoke. HOW WILL I FEEL WHEN I QUIT SMOKING? You may have symptoms of withdrawal because your body is used to nicotine (the addictive substance in cigarettes). You may crave cigarettes, be irritable, feel very hungry, cough often, get headaches, or have difficulty concentrating. The withdrawal symptoms are only temporary. They are strongest when you first quit but will go away within 10-14 days. When withdrawal symptoms occur, stay in control. Think about your reasons for quitting. Remind yourself that these are signs that your body is healing and getting used to being without cigarettes. Remember that withdrawal symptoms are easier to treat than the major diseases that smoking can cause.  Even after the withdrawal is over, expect periodic urges to smoke. However, these cravings are generally short lived and will go away whether you smoke or not. Do not smoke! WHAT RESOURCES ARE AVAILABLE TO HELP ME QUIT SMOKING? Your health care provider can direct you to community resources or hospitals for support, which may include:  Group  support.  Education.  Hypnosis.  Therapy.   This information is not intended to replace advice given to you by your health care provider. Make sure you discuss any questions you have with your health care provider.   Document Released: 01/12/2004 Document Revised: 05/06/2014 Document Reviewed: 10/01/2012 Elsevier Interactive Patient Education 2016 Elsevier Inc.  

## 2015-07-12 NOTE — Progress Notes (Signed)
Initial prenatal info packet given Breastfeeding tip of the week reviewed Declined flu Initial prenatal labs today

## 2015-07-13 LAB — PRENATAL PROFILE (SOLSTAS)
Antibody Screen: NEGATIVE
BASOS PCT: 0 % (ref 0–1)
Basophils Absolute: 0 10*3/uL (ref 0.0–0.1)
EOS ABS: 0.2 10*3/uL (ref 0.0–0.7)
Eosinophils Relative: 1 % (ref 0–5)
HCT: 38.8 % (ref 36.0–46.0)
HEP B S AG: NEGATIVE
HIV 1&2 Ab, 4th Generation: NONREACTIVE
Hemoglobin: 13.1 g/dL (ref 12.0–15.0)
Lymphocytes Relative: 19 % (ref 12–46)
Lymphs Abs: 2.9 10*3/uL (ref 0.7–4.0)
MCH: 29.1 pg (ref 26.0–34.0)
MCHC: 33.8 g/dL (ref 30.0–36.0)
MCV: 86.2 fL (ref 78.0–100.0)
MONOS PCT: 5 % (ref 3–12)
MPV: 11.1 fL (ref 8.6–12.4)
Monocytes Absolute: 0.8 10*3/uL (ref 0.1–1.0)
NEUTROS ABS: 11.5 10*3/uL — AB (ref 1.7–7.7)
NEUTROS PCT: 75 % (ref 43–77)
PLATELETS: 365 10*3/uL (ref 150–400)
RBC: 4.5 MIL/uL (ref 3.87–5.11)
RDW: 14.4 % (ref 11.5–15.5)
Rh Type: POSITIVE
Rubella: 4.13 Index — ABNORMAL HIGH (ref <0.90)
WBC: 15.3 10*3/uL — AB (ref 4.0–10.5)

## 2015-07-13 LAB — AFP, QUAD SCREEN
AFP: 34 ng/mL
Curr Gest Age: 16 wks.days
HCG TOTAL: 32 [IU]/mL
INH: 260.5 pg/mL
INTERPRETATION-AFP: NEGATIVE
MOM FOR AFP: 1
MoM for INH: 1.46
MoM for hCG: 0.8
OPEN SPINA BIFIDA: NEGATIVE
TRI 18 SCR RISK EST: NEGATIVE
Trisomy 18 (Edward) Syndrome Interp.: 1:42900 {titer}
uE3 Mom: 1.41
uE3 Value: 1.11 ng/mL

## 2015-07-13 LAB — GC/CHLAMYDIA PROBE AMP (~~LOC~~) NOT AT ARMC
Chlamydia: NEGATIVE
Neisseria Gonorrhea: NEGATIVE

## 2015-07-14 LAB — PRESCRIPTION MONITORING PROFILE (19 PANEL)
AMPHETAMINE/METH: NEGATIVE ng/mL
BUPRENORPHINE, URINE: NEGATIVE ng/mL
Barbiturate Screen, Urine: NEGATIVE ng/mL
Benzodiazepine Screen, Urine: NEGATIVE ng/mL
CANNABINOID SCRN UR: NEGATIVE ng/mL
COCAINE METABOLITES: NEGATIVE ng/mL
CREATININE, URINE: 71.08 mg/dL (ref >20.0)
Carisoprodol, Urine: NEGATIVE ng/mL
ECSTASY: NEGATIVE ng/mL
Fentanyl, Ur: NEGATIVE ng/mL
MEPERIDINE UR: NEGATIVE ng/mL
METHADONE SCREEN, URINE: NEGATIVE ng/mL
METHAQUALONE SCREEN (URINE): NEGATIVE ng/mL
Nitrites, Initial: NEGATIVE ug/mL
Opiate Screen, Urine: NEGATIVE ng/mL
Oxycodone Screen, Ur: NEGATIVE ng/mL
PH URINE, INITIAL: 5.5 pH (ref 4.5–8.9)
PHENCYCLIDINE, UR: NEGATIVE ng/mL
Propoxyphene: NEGATIVE ng/mL
TAPENTADOLUR: NEGATIVE ng/mL
Tramadol Scrn, Ur: NEGATIVE ng/mL
Zolpidem, Urine: NEGATIVE ng/mL

## 2015-07-14 LAB — CULTURE, OB URINE

## 2015-07-14 LAB — CYSTIC FIBROSIS DIAGNOSTIC STUDY

## 2015-07-14 LAB — CYTOLOGY - PAP

## 2015-08-02 ENCOUNTER — Ambulatory Visit (HOSPITAL_COMMUNITY)
Admission: RE | Admit: 2015-08-02 | Discharge: 2015-08-02 | Disposition: A | Discharge status: Home / Self Care | Payer: BLUE CROSS/BLUE SHIELD | Source: Ambulatory Visit | Attending: Advanced Practice Midwife | Admitting: Advanced Practice Midwife

## 2015-08-02 DIAGNOSIS — Z1389 Encounter for screening for other disorder: Secondary | ICD-10-CM

## 2015-08-02 DIAGNOSIS — Z3A19 19 weeks gestation of pregnancy: Secondary | ICD-10-CM | POA: Insufficient documentation

## 2015-08-02 DIAGNOSIS — O99331 Smoking (tobacco) complicating pregnancy, first trimester: Secondary | ICD-10-CM

## 2015-08-02 DIAGNOSIS — Z36 Encounter for antenatal screening of mother: Secondary | ICD-10-CM | POA: Insufficient documentation

## 2015-08-02 DIAGNOSIS — Z3491 Encounter for supervision of normal pregnancy, unspecified, first trimester: Secondary | ICD-10-CM

## 2015-08-09 ENCOUNTER — Ambulatory Visit (INDEPENDENT_AMBULATORY_CARE_PROVIDER_SITE_OTHER): Payer: BLUE CROSS/BLUE SHIELD | Admitting: Advanced Practice Midwife

## 2015-08-09 VITALS — BP 110/67 | HR 67 | Temp 98.4°F | Wt 149.8 lb

## 2015-08-09 DIAGNOSIS — Z3492 Encounter for supervision of normal pregnancy, unspecified, second trimester: Secondary | ICD-10-CM

## 2015-08-09 LAB — POCT URINALYSIS DIP (DEVICE)
BILIRUBIN URINE: NEGATIVE
GLUCOSE, UA: NEGATIVE mg/dL
KETONES UR: NEGATIVE mg/dL
NITRITE: NEGATIVE
PH: 7 (ref 5.0–8.0)
Protein, ur: NEGATIVE mg/dL
Specific Gravity, Urine: 1.02 (ref 1.005–1.030)
Urobilinogen, UA: 0.2 mg/dL (ref 0.0–1.0)

## 2015-08-09 NOTE — Patient Instructions (Addendum)
Prenatal Yoga at [facility] Tuesday 6:15 pm [address] [place], [place] [address]    Contraception Choices Contraception (birth control) is the use of any methods or devices to prevent pregnancy. Below are some methods to help avoid pregnancy. HORMONAL METHODS   Contraceptive implant. This is a thin, plastic tube containing progesterone hormone. It does not contain estrogen hormone. Your health care provider inserts the tube in the inner part of the upper arm. The tube can remain in place for up to 3 years. After 3 years, the implant must be removed. The implant prevents the ovaries from releasing an egg (ovulation), thickens the cervical mucus to prevent sperm from entering the uterus, and thins the lining of the inside of the uterus.  Progesterone-only injections. These injections are given every 3 months by your health care provider to prevent pregnancy. This synthetic progesterone hormone stops the ovaries from releasing eggs. It also thickens cervical mucus and changes the uterine lining. This makes it harder for sperm to survive in the uterus.  Birth control pills. These pills contain estrogen and progesterone hormone. They work by preventing the ovaries from releasing eggs (ovulation). They also cause the cervical mucus to thicken, preventing the sperm from entering the uterus. Birth control pills are prescribed by a health care provider.Birth control pills can also be used to treat heavy periods.  Minipill. This type of birth control pill contains only the progesterone hormone. They are taken every day of each month and must be prescribed by your health care provider.  Birth control patch. The patch contains hormones similar to those in birth control pills. It must be changed once a week and is prescribed by a health care provider.  Vaginal ring. The ring contains hormones similar to those in birth control pills. It is left in the vagina for 3 weeks, removed for 1 week, and then a  new one is put back in place. The patient must be comfortable inserting and removing the ring from the vagina.A health care provider's prescription is necessary.  Emergency contraception. Emergency contraceptives prevent pregnancy after unprotected sexual intercourse. This pill can be taken right after sex or up to 5 days after unprotected sex. It is most effective the sooner you take the pills after having sexual intercourse. Most emergency contraceptive pills are available without a prescription. Check with your pharmacist. Do not use emergency contraception as your only form of birth control. BARRIER METHODS   Female condom. This is a thin sheath (latex or rubber) that is worn over the penis during sexual intercourse. It can be used with spermicide to increase effectiveness.  Female condom. This is a soft, loose-fitting sheath that is put into the vagina before sexual intercourse.  Diaphragm. This is a soft, latex, dome-shaped barrier that must be fitted by a health care provider. It is inserted into the vagina, along with a spermicidal jelly. It is inserted before intercourse. The diaphragm should be left in the vagina for 6 to 8 hours after intercourse.  Cervical cap. This is a round, soft, latex or plastic cup that fits over the cervix and must be fitted by a health care provider. The cap can be left in place for up to 48 hours after intercourse.  Sponge. This is a soft, circular piece of polyurethane foam. The sponge has spermicide in it. It is inserted into the vagina after wetting it and before sexual intercourse.  Spermicides. These are chemicals that kill or block sperm from entering the cervix and uterus.  They come in the form of creams, jellies, suppositories, foam, or tablets. They do not require a prescription. They are inserted into the vagina with an applicator before having sexual intercourse. The process must be repeated every time you have sexual intercourse. INTRAUTERINE  CONTRACEPTION  Intrauterine device (IUD). This is a T-shaped device that is put in a woman's uterus during a menstrual period to prevent pregnancy. There are 2 types:  Copper IUD. This type of IUD is wrapped in copper wire and is placed inside the uterus. Copper makes the uterus and fallopian tubes produce a fluid that kills sperm. It can stay in place for 10 years.  Hormone IUD. This type of IUD contains the hormone progestin (synthetic progesterone). The hormone thickens the cervical mucus and prevents sperm from entering the uterus, and it also thins the uterine lining to prevent implantation of a fertilized egg. The hormone can weaken or kill the sperm that get into the uterus. It can stay in place for 3-5 years, depending on which type of IUD is used. PERMANENT METHODS OF CONTRACEPTION  Female tubal ligation. This is when the woman's fallopian tubes are surgically sealed, tied, or blocked to prevent the egg from traveling to the uterus.  Hysteroscopic sterilization. This involves placing a small coil or insert into each fallopian tube. Your doctor uses a technique called hysteroscopy to do the procedure. The device causes scar tissue to form. This results in permanent blockage of the fallopian tubes, so the sperm cannot fertilize the egg. It takes about 3 months after the procedure for the tubes to become blocked. You must use another form of birth control for these 3 months.  Female sterilization. This is when the female has the tubes that carry sperm tied off (vasectomy).This blocks sperm from entering the vagina during sexual intercourse. After the procedure, the man can still ejaculate fluid (semen). NATURAL PLANNING METHODS  Natural family planning. This is not having sexual intercourse or using a barrier method (condom, diaphragm, cervical cap) on days the woman could become pregnant.  Calendar method. This is keeping track of the length of each menstrual cycle and identifying when you are  fertile.  Ovulation method. This is avoiding sexual intercourse during ovulation.  Symptothermal method. This is avoiding sexual intercourse during ovulation, using a thermometer and ovulation symptoms.  Post-ovulation method. This is timing sexual intercourse after you have ovulated. Regardless of which type or method of contraception you choose, it is important that you use condoms to protect against the transmission of sexually transmitted infections (STIs). Talk with your health care provider about which form of contraception is most appropriate for you.   This information is not intended to replace advice given to you by your health care provider. Make sure you discuss any questions you have with your health care provider.   Document Released: 04/15/2005 Document Revised: 04/20/2013 Document Reviewed: 10/08/2012 Elsevier Interactive Patient Education [employer]2016 Elsevier Inc.

## 2015-08-09 NOTE — Progress Notes (Signed)
Pain- lower back, cramps  Educated pt on Benefits of Breastfeeding for [employer]

## 2015-08-13 NOTE — Progress Notes (Signed)
Subjective:  Madison Bowman is a 30 y.o. G4P0030 at 3474w0d being seen today for ongoing prenatal care.  She is currently monitored for the following issues for this low-risk pregnancy and has Supervision of low-risk pregnancy and Tobacco smoking complicating pregnancy in first trimester on her problem list.  Patient reports constant backache.  Contractions: Not present. Vag. Bleeding: None.   . Denies leaking of fluid.   The following portions of the patient's history were reviewed and updated as appropriate: allergies, current medications, past family history, past medical history, past social history, past surgical history and problem list. Problem list updated.  Objective:   Filed Vitals:   08/09/15 1121  BP: 110/67  Pulse: 67  Temp: 98.4 F (36.9 C)  Weight: 149 lb 12.8 oz (67.949 kg)    Fetal Status: Fetal Heart Rate (bpm): 156         General:  Alert, oriented and cooperative. Patient is in no acute distress.  Skin: Skin is warm and dry. No rash noted.   Cardiovascular: Normal heart rate noted  Respiratory: Normal respiratory effort, no problems with respiration noted  Abdomen: Soft, gravid, appropriate for gestational age. Pain/Pressure: Present     Back Mild TTP in low back. Normal ROM. No CVAT  Pelvic: Vag. Bleeding: None     Cervical exam declined        Extremities: Normal range of motion.  Edema: None  Mental Status: Normal mood and affect. Normal behavior. Normal judgment and thought content.   Urinalysis: Urine Protein: Negative Urine Glucose: Negative  Assessment and Plan:  Pregnancy: G4P0030 at 254w4d  1. Supervision of low-risk pregnancy, second trimester   Preterm labor symptoms and general obstetric precautions including but not limited to vaginal bleeding, contractions, leaking of fluid and fetal movement were reviewed in detail with the patient. Please refer to After Visit Summary for other counseling recommendations.  Discussed normal discomforts of  pregnancy vs red flags. Comfort measures.  Return in about 4 weeks (around 09/06/2015).   Madison KinsmanVirginia Aldon Bowman, CNM

## 2015-09-06 ENCOUNTER — Encounter: Payer: Self-pay | Admitting: Advanced Practice Midwife

## 2015-09-06 ENCOUNTER — Ambulatory Visit (INDEPENDENT_AMBULATORY_CARE_PROVIDER_SITE_OTHER): Payer: BLUE CROSS/BLUE SHIELD | Admitting: Advanced Practice Midwife

## 2015-09-06 VITALS — BP 119/64 | HR 70 | Wt 153.9 lb

## 2015-09-06 DIAGNOSIS — Z3492 Encounter for supervision of normal pregnancy, unspecified, second trimester: Secondary | ICD-10-CM

## 2015-09-06 LAB — POCT URINALYSIS DIP (DEVICE)
BILIRUBIN URINE: NEGATIVE
Glucose, UA: NEGATIVE mg/dL
HGB URINE DIPSTICK: NEGATIVE
Ketones, ur: NEGATIVE mg/dL
NITRITE: NEGATIVE
PH: 7 (ref 5.0–8.0)
PROTEIN: NEGATIVE mg/dL
Specific Gravity, Urine: 1.015 (ref 1.005–1.030)
Urobilinogen, UA: 0.2 mg/dL (ref 0.0–1.0)

## 2015-09-06 NOTE — Progress Notes (Signed)
Breastfeeding tip reviewed 

## 2015-09-06 NOTE — Progress Notes (Signed)
Subjective:  Madison Bowman is a 30 y.o. G4P0030 at 6981w0d being seen today for ongoing prenatal care.  She is currently monitored for the following issues for this low-risk pregnancy and has Supervision of low-risk pregnancy and Tobacco smoking complicating pregnancy in first trimester on her problem list.  Patient reports no complaints.  Contractions: Not present. Vag. Bleeding: None.  Movement: Present. Denies leaking of fluid.   The following portions of the patient's history were reviewed and updated as appropriate: allergies, current medications, past family history, past medical history, past social history, past surgical history and problem list. Problem list updated.  Objective:   Filed Vitals:   09/06/15 1045  BP: 119/64  Pulse: 70  Weight: 153 lb 14.4 oz (69.809 kg)    Fetal Status: Fetal Heart Rate (bpm): 154   Movement: Present     General:  Alert, oriented and cooperative. Patient is in no acute distress.  Skin: Skin is warm and dry. No rash noted.   Cardiovascular: Normal heart rate noted  Respiratory: Normal respiratory effort, no problems with respiration noted  Abdomen: Soft, gravid, appropriate for gestational age. Pain/Pressure: Present     Pelvic: Vag. Bleeding: None     Cervical exam deferred        Extremities: Normal range of motion.  Edema: None  Mental Status: Normal mood and affect. Normal behavior. Normal judgment and thought content.   Urinalysis:      Assessment and Plan:  Pregnancy: G4P0030 at 4081w0d  1. Supervision of low-risk pregnancy, second trimester       Doing well   Preterm labor symptoms and general obstetric precautions including but not limited to vaginal bleeding, contractions, leaking of fluid and fetal movement were reviewed in detail with the patient. Please refer to After Visit Summary for other counseling recommendations.  RTC 4 week  Aviva SignsMarie L Secilia Apps, CNM

## 2015-09-06 NOTE — Patient Instructions (Signed)

## 2015-10-10 ENCOUNTER — Ambulatory Visit (INDEPENDENT_AMBULATORY_CARE_PROVIDER_SITE_OTHER): Payer: BLUE CROSS/BLUE SHIELD | Admitting: Family Medicine

## 2015-10-10 DIAGNOSIS — Z3480 Encounter for supervision of other normal pregnancy, unspecified trimester: Secondary | ICD-10-CM | POA: Diagnosis not present

## 2015-10-10 DIAGNOSIS — Z23 Encounter for immunization: Secondary | ICD-10-CM | POA: Diagnosis not present

## 2015-10-10 DIAGNOSIS — Z3493 Encounter for supervision of normal pregnancy, unspecified, third trimester: Secondary | ICD-10-CM

## 2015-10-10 LAB — CBC
HCT: 34.7 % — ABNORMAL LOW (ref 35.0–45.0)
Hemoglobin: 11.4 g/dL — ABNORMAL LOW (ref 11.7–15.5)
MCH: 28.6 pg (ref 27.0–33.0)
MCHC: 32.9 g/dL (ref 32.0–36.0)
MCV: 87.2 fL (ref 80.0–100.0)
MPV: 11.8 fL (ref 7.5–12.5)
PLATELETS: 292 10*3/uL (ref 140–400)
RBC: 3.98 MIL/uL (ref 3.80–5.10)
RDW: 13.7 % (ref 11.0–15.0)
WBC: 17.5 10*3/uL — AB (ref 3.8–10.8)

## 2015-10-10 LAB — POCT URINALYSIS DIP (DEVICE)
Bilirubin Urine: NEGATIVE
Glucose, UA: NEGATIVE mg/dL
Hgb urine dipstick: NEGATIVE
Ketones, ur: NEGATIVE mg/dL
Nitrite: NEGATIVE
Protein, ur: NEGATIVE mg/dL
Specific Gravity, Urine: 1.015 (ref 1.005–1.030)
Urobilinogen, UA: 0.2 mg/dL (ref 0.0–1.0)
pH: 7 (ref 5.0–8.0)

## 2015-10-10 NOTE — Progress Notes (Signed)
Subjective:  Madison Bowman is a 30 y.o. G4P0030 at 5989w6d being seen today for ongoing prenatal care.  She is currently monitored for the following issues for this low-risk pregnancy and has Supervision of low-risk pregnancy and Tobacco smoking complicating pregnancy in first trimester on her problem list.  Patient reports no complaints.  Contractions: Not present. Vag. Bleeding: None.  Movement: Present. Denies leaking of fluid.   The following portions of the patient's history were reviewed and updated as appropriate: allergies, current medications, past family history, past medical history, past social history, past surgical history and problem list. Problem list updated.  Objective:   Filed Vitals:   10/10/15 1051  BP: 116/65  Pulse: 68  Temp: 98.2 F (36.8 C)  Weight: 157 lb 14.4 oz (71.623 kg)    Fetal Status: Fetal Heart Rate (bpm): 133 Fundal Height: 29 cm Movement: Present     General:  Alert, oriented and cooperative. Patient is in no acute distress.  Skin: Skin is warm and dry. No rash noted.   Cardiovascular: Normal heart rate noted  Respiratory: Normal respiratory effort, no problems with respiration noted  Abdomen: Soft, gravid, appropriate for gestational age. Pain/Pressure: Absent     Pelvic: Cervical exam deferred        Extremities: Normal range of motion.  Edema: None  Mental Status: Normal mood and affect. Normal behavior. Normal judgment and thought content.   Urinalysis: Urine Protein: Negative Urine Glucose: Negative  Assessment and Plan:  Pregnancy: G4P0030 at 7789w6d  1. Supervision of low-risk pregnancy, third trimester Continue routine prenatal care. 28 wk labs today - CBC - RPR - HIV antibody (with reflex) - Glucose Tolerance, 1 HR (50g) w/o Fasting - Tdap vaccine greater than or equal to 7yo IM  Preterm labor symptoms and general obstetric precautions including but not limited to vaginal bleeding, contractions, leaking of fluid and fetal movement  were reviewed in detail with the patient. Please refer to After Visit Summary for other counseling recommendations.  Return in 2 weeks (on 10/24/2015).   Reva Boresanya S Yue Glasheen, MD

## 2015-10-10 NOTE — Progress Notes (Signed)
Trace WBC in urine

## 2015-10-10 NOTE — Patient Instructions (Signed)
Third Trimester of Pregnancy The third trimester is from week 29 through week 42, months 7 through 9. The third trimester is a time when the fetus is growing rapidly. At the end of the ninth month, the fetus is about 20 inches in length and weighs 6-10 pounds.  BODY CHANGES Your body goes through many changes during pregnancy. The changes vary from woman to woman.   Your weight will continue to increase. You can expect to gain 25-35 pounds (11-16 kg) by the end of the pregnancy.  You may begin to get stretch marks on your hips, abdomen, and breasts.  You may urinate more often because the fetus is moving lower into your pelvis and pressing on your bladder.  You may develop or continue to have heartburn as a result of your pregnancy.  You may develop constipation because certain hormones are causing the muscles that push waste through your intestines to slow down.  You may develop hemorrhoids or swollen, bulging veins (varicose veins).  You may have pelvic pain because of the weight gain and pregnancy hormones relaxing your joints between the bones in your pelvis. Backaches may result from overexertion of the muscles supporting your posture.  You may have changes in your hair. These can include thickening of your hair, rapid growth, and changes in texture. Some women also have hair loss during or after pregnancy, or hair that feels dry or thin. Your hair will most likely return to normal after your baby is born.  Your breasts will continue to grow and be tender. A yellow discharge may leak from your breasts called colostrum.  Your belly button may stick out.  You may feel short of breath because of your expanding uterus.  You may notice the fetus "dropping," or moving lower in your abdomen.  You may have a bloody mucus discharge. This usually occurs a few days to a week before labor begins.  Your cervix becomes thin and soft (effaced) near your due date. WHAT TO EXPECT AT YOUR  PRENATAL EXAMS  You will have prenatal exams every 2 weeks until week 36. Then, you will have weekly prenatal exams. During a routine prenatal visit:  You will be weighed to make sure you and the fetus are growing normally.  Your blood pressure is taken.  Your abdomen will be measured to track your baby's growth.  The fetal heartbeat will be listened to.  Any test results from the previous visit will be discussed.  You may have a cervical check near your due date to see if you have effaced. At around 36 weeks, your caregiver will check your cervix. At the same time, your caregiver will also perform a test on the secretions of the vaginal tissue. This test is to determine if a type of bacteria, Group B streptococcus, is present. Your caregiver will explain this further. Your caregiver may ask you:  What your birth plan is.  How you are feeling.  If you are feeling the baby move.  If you have had any abnormal symptoms, such as leaking fluid, bleeding, severe headaches, or abdominal cramping.  If you are using any tobacco products, including cigarettes, chewing tobacco, and electronic cigarettes.  If you have any questions. Other tests or screenings that may be performed during your third trimester include:  Blood tests that check for low iron levels (anemia).  Fetal testing to check the health, activity level, and growth of the fetus. Testing is done if you have certain medical conditions or if   there are problems during the pregnancy.  HIV (human immunodeficiency virus) testing. If you are at high risk, you may be screened for HIV during your third trimester of pregnancy. FALSE LABOR You may feel small, irregular contractions that eventually go away. These are called Braxton Hicks contractions, or false labor. Contractions may last for hours, days, or even weeks before true labor sets in. If contractions come at regular intervals, intensify, or become painful, it is best to be seen  by your caregiver.  SIGNS OF LABOR   Menstrual-like cramps.  Contractions that are 5 minutes apart or less.  Contractions that start on the top of the uterus and spread down to the lower abdomen and back.  A sense of increased pelvic pressure or back pain.  A watery or bloody mucus discharge that comes from the vagina. If you have any of these signs before the 37th week of pregnancy, call your caregiver right away. You need to go to the hospital to get checked immediately. HOME CARE INSTRUCTIONS   Avoid all smoking, herbs, alcohol, and unprescribed drugs. These chemicals affect the formation and growth of the baby.  Do not use any tobacco products, including cigarettes, chewing tobacco, and electronic cigarettes. If you need help quitting, ask your health care provider. You may receive counseling support and other resources to help you quit.  Follow your caregiver's instructions regarding medicine use. There are medicines that are either safe or unsafe to take during pregnancy.  Exercise only as directed by your caregiver. Experiencing uterine cramps is a good sign to stop exercising.  Continue to eat regular, healthy meals.  Wear a good support bra for breast tenderness.  Do not use hot tubs, steam rooms, or saunas.  Wear your seat belt at all times when driving.  Avoid raw meat, uncooked cheese, cat litter boxes, and soil used by cats. These carry germs that can cause birth defects in the baby.  Take your prenatal vitamins.  Take 1500-2000 mg of calcium daily starting at the 20th week of pregnancy until you deliver your baby.  Try taking a stool softener (if your caregiver approves) if you develop constipation. Eat more high-fiber foods, such as fresh vegetables or fruit and whole grains. Drink plenty of fluids to keep your urine clear or pale yellow.  Take warm sitz baths to soothe any pain or discomfort caused by hemorrhoids. Use hemorrhoid cream if your caregiver  approves.  If you develop varicose veins, wear support hose. Elevate your feet for 15 minutes, 3-4 times a day. Limit salt in your diet.  Avoid heavy lifting, wear low heal shoes, and practice good posture.  Rest a lot with your legs elevated if you have leg cramps or low back pain.  Visit your dentist if you have not gone during your pregnancy. Use a soft toothbrush to brush your teeth and be gentle when you floss.  A sexual relationship may be continued unless your caregiver directs you otherwise.  Do not travel far distances unless it is absolutely necessary and only with the approval of your caregiver.  Take prenatal classes to understand, practice, and ask questions about the labor and delivery.  Make a trial run to the hospital.  Pack your hospital bag.  Prepare the baby's nursery.  Continue to go to all your prenatal visits as directed by your caregiver. SEEK MEDICAL CARE IF:  You are unsure if you are in labor or if your water has broken.  You have dizziness.  You have   mild pelvic cramps, pelvic pressure, or nagging pain in your abdominal area.  You have persistent nausea, vomiting, or diarrhea.  You have a bad smelling vaginal discharge.  You have pain with urination. SEEK IMMEDIATE MEDICAL CARE IF:   You have a fever.  You are leaking fluid from your vagina.  You have spotting or bleeding from your vagina.  You have severe abdominal cramping or pain.  You have rapid weight loss or gain.  You have shortness of breath with chest pain.  You notice sudden or extreme swelling of your face, hands, ankles, feet, or legs.  You have not felt your baby move in over an hour.  You have severe headaches that do not go away with medicine.  You have vision changes.   This information is not intended to replace advice given to you by your health care provider. Make sure you discuss any questions you have with your health care provider.   Document Released:  04/09/2001 Document Revised: 05/06/2014 Document Reviewed: 06/16/2012 Elsevier Interactive Patient Education 2016 [employer]  Breastfeeding Deciding to breastfeed is one of the best choices you can make for you and your baby. A change in hormones during pregnancy causes your breast tissue to grow and increases the number and size of your milk ducts. These hormones also allow proteins, sugars, and fats from your blood supply to make breast milk in your milk-producing glands. Hormones prevent breast milk from being released before your baby is born as well as prompt milk flow after birth. Once breastfeeding has begun, thoughts of your baby, as well as his or her sucking or crying, can stimulate the release of milk from your milk-producing glands.  BENEFITS OF BREASTFEEDING For Your Baby  Your first milk (colostrum) helps your baby's digestive system function better.  There are antibodies in your milk that help your baby fight off infections.  Your baby has a lower incidence of asthma, allergies, and sudden infant death syndrome.  The nutrients in breast milk are better for your baby than infant formulas and are designed uniquely for your baby's needs.  Breast milk improves your baby's brain development.  Your baby is less likely to develop other conditions, such as childhood obesity, asthma, or type 2 diabetes mellitus. For You  Breastfeeding helps to create a very special bond between you and your baby.  Breastfeeding is convenient. Breast milk is always available at the correct temperature and costs nothing.  Breastfeeding helps to burn calories and helps you lose the weight gained during pregnancy.  Breastfeeding makes your uterus contract to its prepregnancy size faster and slows bleeding (lochia) after you give birth.   Breastfeeding helps to lower your risk of developing type 2 diabetes mellitus, osteoporosis, and breast or ovarian cancer later in life. SIGNS THAT YOUR BABY IS  HUNGRY Early Signs of Hunger  Increased alertness or activity.  Stretching.  Movement of the head from side to side.  Movement of the head and opening of the mouth when the corner of the mouth or cheek is stroked (rooting).  Increased sucking sounds, smacking lips, cooing, sighing, or squeaking.  Hand-to-mouth movements.  Increased sucking of fingers or hands. Late Signs of Hunger  Fussing.  Intermittent crying. Extreme Signs of Hunger Signs of extreme hunger will require calming and consoling before your baby will be able to breastfeed successfully. Do not wait for the following signs of extreme hunger to occur before you initiate breastfeeding:  Restlessness.  A loud, strong cry.  Screaming.   BREASTFEEDING BASICS Breastfeeding Initiation  Find a comfortable place to sit or lie down, with your neck and back well supported.  Place a pillow or rolled up blanket under your baby to bring him or her to the level of your breast (if you are seated). Nursing pillows are specially designed to help support your arms and your baby while you breastfeed.  Make sure that your baby's abdomen is facing your abdomen.  Gently massage your breast. With your fingertips, massage from your chest wall toward your nipple in a circular motion. This encourages milk flow. You may need to continue this action during the feeding if your milk flows slowly.  Support your breast with 4 fingers underneath and your thumb above your nipple. Make sure your fingers are well away from your nipple and your baby's mouth.  Stroke your baby's lips gently with your finger or nipple.  When your baby's mouth is open wide enough, quickly bring your baby to your breast, placing your entire nipple and as much of the colored area around your nipple (areola) as possible into your baby's mouth.  More areola should be visible above your baby's upper lip than below the lower lip.  Your baby's tongue should be between his  or her lower gum and your breast.  Ensure that your baby's mouth is correctly positioned around your nipple (latched). Your baby's lips should create a seal on your breast and be turned out (everted).  It is common for your baby to suck about 2-3 minutes in order to start the flow of breast milk. Latching Teaching your baby how to latch on to your breast properly is very important. An improper latch can cause nipple pain and decreased milk supply for you and poor weight gain in your baby. Also, if your baby is not latched onto your nipple properly, he or she may swallow some air during feeding. This can make your baby fussy. Burping your baby when you switch breasts during the feeding can help to get rid of the air. However, teaching your baby to latch on properly is still the best way to prevent fussiness from swallowing air while breastfeeding. Signs that your baby has successfully latched on to your nipple:  Silent tugging or silent sucking, without causing you pain.  Swallowing heard between every 3-4 sucks.  Muscle movement above and in front of his or her ears while sucking. Signs that your baby has not successfully latched on to nipple:  Sucking sounds or smacking sounds from your baby while breastfeeding.  Nipple pain. If you think your baby has not latched on correctly, slip your finger into the corner of your baby's mouth to break the suction and place it between your baby's gums. Attempt breastfeeding initiation again. Signs of Successful Breastfeeding Signs from your baby:  A gradual decrease in the number of sucks or complete cessation of sucking.  Falling asleep.  Relaxation of his or her body.  Retention of a small amount of milk in his or her mouth.  Letting go of your breast by himself or herself. Signs from you:  Breasts that have increased in firmness, weight, and size 1-3 hours after feeding.  Breasts that are softer immediately after  breastfeeding.  Increased milk volume, as well as a change in milk consistency and color by the fifth day of breastfeeding.  Nipples that are not sore, cracked, or bleeding. Signs That Your Baby is Getting Enough Milk  Wetting at least 3 diapers in a 24-hour period.   The urine should be clear and pale yellow by age 5 days.  At least 3 stools in a 24-hour period by age 5 days. The stool should be soft and yellow.  At least 3 stools in a 24-hour period by age 7 days. The stool should be seedy and yellow.  No loss of weight greater than 10% of birth weight during the first 3 days of age.  Average weight gain of 4-7 ounces (113-198 g) per week after age 4 days.  Consistent daily weight gain by age 5 days, without weight loss after the age of 2 weeks. After a feeding, your baby may spit up a small amount. This is common. BREASTFEEDING FREQUENCY AND DURATION Frequent feeding will help you make more milk and can prevent sore nipples and breast engorgement. Breastfeed when you feel the need to reduce the fullness of your breasts or when your baby shows signs of hunger. This is called "breastfeeding on demand." Avoid introducing a pacifier to your baby while you are working to establish breastfeeding (the first 4-6 weeks after your baby is born). After this time you may choose to use a pacifier. Research has shown that pacifier use during the first year of a baby's life decreases the risk of sudden infant death syndrome (SIDS). Allow your baby to feed on each breast as long as he or she wants. Breastfeed until your baby is finished feeding. When your baby unlatches or falls asleep while feeding from the first breast, offer the second breast. Because newborns are often sleepy in the first few weeks of life, you may need to awaken your baby to get him or her to feed. Breastfeeding times will vary from baby to baby. However, the following rules can serve as a guide to help you ensure that your baby is  properly fed:  Newborns (babies 4 weeks of age or younger) may breastfeed every 1-3 hours.  Newborns should not go longer than 3 hours during the day or 5 hours during the night without breastfeeding.  You should breastfeed your baby a minimum of 8 times in a 24-hour period until you begin to introduce solid foods to your baby at around 6 months of age. BREAST MILK PUMPING Pumping and storing breast milk allows you to ensure that your baby is exclusively fed your breast milk, even at times when you are unable to breastfeed. This is especially important if you are going back to work while you are still breastfeeding or when you are not able to be present during feedings. Your lactation consultant can give you guidelines on how long it is safe to store breast milk. A breast pump is a machine that allows you to pump milk from your breast into a sterile bottle. The pumped breast milk can then be stored in a refrigerator or freezer. Some breast pumps are operated by hand, while others use electricity. Ask your lactation consultant which type will work best for you. Breast pumps can be purchased, but some hospitals and breastfeeding support groups lease breast pumps on a monthly basis. A lactation consultant can teach you how to hand express breast milk, if you prefer not to use a pump. CARING FOR YOUR BREASTS WHILE YOU BREASTFEED Nipples can become dry, cracked, and sore while breastfeeding. The following recommendations can help keep your breasts moisturized and healthy:  Avoid using soap on your nipples.  Wear a supportive bra. Although not required, special nursing bras and tank tops are designed to allow access to your   breasts for breastfeeding without taking off your entire bra or top. Avoid wearing underwire-style bras or extremely tight bras.  Air dry your nipples for 3-4minutes after each feeding.  Use only cotton bra pads to absorb leaked breast milk. Leaking of breast milk between feedings  is normal.  Use lanolin on your nipples after breastfeeding. Lanolin helps to maintain your skin's normal moisture barrier. If you use pure lanolin, you do not need to wash it off before feeding your baby again. Pure lanolin is not toxic to your baby. You may also hand express a few drops of breast milk and gently massage that milk into your nipples and allow the milk to air dry. In the first few weeks after giving birth, some women experience extremely full breasts (engorgement). Engorgement can make your breasts feel heavy, warm, and tender to the touch. Engorgement peaks within 3-5 days after you give birth. The following recommendations can help ease engorgement:  Completely empty your breasts while breastfeeding or pumping. You may want to start by applying warm, moist heat (in the shower or with warm water-soaked hand towels) just before feeding or pumping. This increases circulation and helps the milk flow. If your baby does not completely empty your breasts while breastfeeding, pump any extra milk after he or she is finished.  Wear a snug bra (nursing or regular) or tank top for 1-2 days to signal your body to slightly decrease milk production.  Apply ice packs to your breasts, unless this is too uncomfortable for you.  Make sure that your baby is latched on and positioned properly while breastfeeding. If engorgement persists after 48 hours of following these recommendations, contact your health care provider or a lactation consultant. OVERALL HEALTH CARE RECOMMENDATIONS WHILE BREASTFEEDING  Eat healthy foods. Alternate between meals and snacks, eating 3 of each per day. Because what you eat affects your breast milk, some of the foods may make your baby more irritable than usual. Avoid eating these foods if you are sure that they are negatively affecting your baby.  Drink milk, fruit juice, and water to satisfy your thirst (about 10 glasses a day).  Rest often, relax, and continue to take  your prenatal vitamins to prevent fatigue, stress, and anemia.  Continue breast self-awareness checks.  Avoid chewing and smoking tobacco. Chemicals from cigarettes that pass into breast milk and exposure to secondhand smoke may harm your baby.  Avoid alcohol and drug use, including marijuana. Some medicines that may be harmful to your baby can pass through breast milk. It is important to ask your health care provider before taking any medicine, including all over-the-counter and prescription medicine as well as vitamin and herbal supplements. It is possible to become pregnant while breastfeeding. If birth control is desired, ask your health care provider about options that will be safe for your baby. SEEK MEDICAL CARE IF:  You feel like you want to stop breastfeeding or have become frustrated with breastfeeding.  You have painful breasts or nipples.  Your nipples are cracked or bleeding.  Your breasts are red, tender, or warm.  You have a swollen area on either breast.  You have a fever or chills.  You have nausea or vomiting.  You have drainage other than breast milk from your nipples.  Your breasts do not become full before feedings by the fifth day after you give birth.  You feel sad and depressed.  Your baby is too sleepy to eat well.  Your baby is having trouble sleeping.     Your baby is wetting less than 3 diapers in a 24-hour period.  Your baby has less than 3 stools in a 24-hour period.  Your baby's skin or the white part of his or her eyes becomes yellow.   Your baby is not gaining weight by 5 days of age. SEEK IMMEDIATE MEDICAL CARE IF:  Your baby is overly tired (lethargic) and does not want to wake up and feed.  Your baby develops an unexplained fever.   This information is not intended to replace advice given to you by your health care provider. Make sure you discuss any questions you have with your health care provider.   Document Released: 04/15/2005  Document Revised: 01/04/2015 Document Reviewed: 10/07/2012 Elsevier Interactive Patient Education 2016 [employer]  

## 2015-10-11 ENCOUNTER — Telehealth: Payer: Self-pay | Admitting: General Practice

## 2015-10-11 LAB — RPR

## 2015-10-11 LAB — GLUCOSE TOLERANCE, 1 HOUR (50G) W/O FASTING: GLUCOSE, 1 HR, GESTATIONAL: 164 mg/dL — AB (ref <140)

## 2015-10-11 LAB — HIV ANTIBODY (ROUTINE TESTING W REFLEX): HIV 1&2 Ab, 4th Generation: NONREACTIVE

## 2015-10-11 NOTE — Telephone Encounter (Signed)
Patient called back into clinic stating she is returning our call & has seen her results. Called patient, no answer- left message stating I was trying to return your call, but will send you a mychart message

## 2015-10-11 NOTE — Telephone Encounter (Signed)
Patient needs 3 hr gtt. Called patient, no answer- left message stating we are trying to reach you with results, please call us back at the clinics 

## 2015-10-12 ENCOUNTER — Other Ambulatory Visit: Payer: BLUE CROSS/BLUE SHIELD

## 2015-10-12 DIAGNOSIS — R7302 Impaired glucose tolerance (oral): Secondary | ICD-10-CM | POA: Diagnosis not present

## 2015-10-12 DIAGNOSIS — R7309 Other abnormal glucose: Secondary | ICD-10-CM

## 2015-10-14 LAB — GLUCOSE TOLERANCE, 3 HOURS
GLUCOSE 3 HOUR GTT: 121 mg/dL (ref <145)
GLUCOSE, FASTING-GESTATIONAL: 85 mg/dL (ref 65–104)
Glucose Tolerance, 1 hour: 193 mg/dL — ABNORMAL HIGH (ref <190)
Glucose Tolerance, 2 hour: 157 mg/dL (ref <165)

## 2015-10-16 ENCOUNTER — Encounter: Payer: Self-pay | Admitting: Obstetrics & Gynecology

## 2015-10-16 DIAGNOSIS — O24419 Gestational diabetes mellitus in pregnancy, unspecified control: Secondary | ICD-10-CM | POA: Insufficient documentation

## 2015-10-18 ENCOUNTER — Telehealth: Payer: Self-pay | Admitting: General Practice

## 2015-10-18 DIAGNOSIS — O24419 Gestational diabetes mellitus in pregnancy, unspecified control: Secondary | ICD-10-CM

## 2015-10-18 NOTE — Telephone Encounter (Signed)
Spoke w/pt and advised that her 3hr GTT was abnormal and indicates a diagnosis of gestational diabetes. She will need to meet with Diabetes educator for nutrition instruction and self CBG testing education. Pt voiced understanding and agreed to appt on 6/22 @ 1530 w/diabetes educator in [facility]. Pt also advised that she will need ultrasound soon to evaluate the growth of baby. She will be called back with appt information for ultrasound.   1425  Called pt back and advised that her US appt is scheduled 6/28 @ 0945.  She voiced understanding.

## 2015-10-18 NOTE — Telephone Encounter (Signed)
Per Dr Macon LargeAnyanwu, patient's 3 hr gtt is abnormal indicating GDM. Patient needs to return on Monday for diabetes ed & nutrition. Patient also needs growth scan asap. Called patient, no answer- left message stating we are trying to reach you with results, please call us back at the clinics

## 2015-10-19 ENCOUNTER — Encounter: Payer: BLUE CROSS/BLUE SHIELD | Attending: Obstetrics & Gynecology | Admitting: *Deleted

## 2015-10-19 ENCOUNTER — Ambulatory Visit (HOSPITAL_COMMUNITY)
Admission: RE | Admit: 2015-10-19 | Discharge: 2015-10-19 | Disposition: A | Discharge status: Home / Self Care | Payer: BLUE CROSS/BLUE SHIELD | Source: Ambulatory Visit | Attending: Obstetrics & Gynecology | Admitting: Obstetrics & Gynecology

## 2015-10-19 DIAGNOSIS — O24419 Gestational diabetes mellitus in pregnancy, unspecified control: Secondary | ICD-10-CM | POA: Insufficient documentation

## 2015-10-19 DIAGNOSIS — O24410 Gestational diabetes mellitus in pregnancy, diet controlled: Secondary | ICD-10-CM

## 2015-10-19 NOTE — Progress Notes (Signed)
  Patient was seen on 10/19/2015 for Gestational Diabetes self-management visit. The following learning objectives were met by the patient during this visit:   States the definition of Gestational Diabetes  States why dietary management is important in controlling blood glucose  Describes the effects each nutrient has on blood glucose levels  Demonstrates ability to create a balanced meal plan  Demonstrates carbohydrate counting   States when to check blood glucose levels  Demonstrates proper blood glucose monitoring techniques  States the effect of stress and exercise on blood glucose levels  Blood glucose monitor instructed. Suggested she request Rx for Accu Chek Aviva meter with Fast Clix Drum lancing device  Patient instructed to monitor glucose levels: FBS: 60 - <90 2 hour: <120  *Patient received handouts:  Nutrition Diabetes and Pregnancy  Carbohydrate Counting List  Patient will be seen for follow-up as needed.

## 2015-10-19 NOTE — Addendum Note (Signed)
Addended by: Jill SideAY, Rockne Dearinger L on: 10/19/2015 07:55 AM   Modules accepted: Orders

## 2015-10-25 ENCOUNTER — Ambulatory Visit (INDEPENDENT_AMBULATORY_CARE_PROVIDER_SITE_OTHER): Payer: BLUE CROSS/BLUE SHIELD | Admitting: Family

## 2015-10-25 ENCOUNTER — Ambulatory Visit (HOSPITAL_COMMUNITY)
Admission: RE | Admit: 2015-10-25 | Discharge: 2015-10-25 | Disposition: A | Discharge status: Home / Self Care | Payer: BLUE CROSS/BLUE SHIELD | Source: Ambulatory Visit | Attending: Obstetrics & Gynecology | Admitting: Obstetrics & Gynecology

## 2015-10-25 ENCOUNTER — Encounter (HOSPITAL_COMMUNITY): Payer: Self-pay

## 2015-10-25 VITALS — BP 113/69 | HR 66 | Wt 158.8 lb

## 2015-10-25 VITALS — BP 109/89 | HR 77 | Wt 159.6 lb

## 2015-10-25 DIAGNOSIS — O24419 Gestational diabetes mellitus in pregnancy, unspecified control: Secondary | ICD-10-CM

## 2015-10-25 DIAGNOSIS — Z3A31 31 weeks gestation of pregnancy: Secondary | ICD-10-CM | POA: Insufficient documentation

## 2015-10-25 DIAGNOSIS — O24410 Gestational diabetes mellitus in pregnancy, diet controlled: Secondary | ICD-10-CM | POA: Diagnosis not present

## 2015-10-25 DIAGNOSIS — O0993 Supervision of high risk pregnancy, unspecified, third trimester: Secondary | ICD-10-CM

## 2015-10-25 LAB — POCT URINALYSIS DIP (DEVICE)
Bilirubin Urine: NEGATIVE
Glucose, UA: NEGATIVE mg/dL
Hgb urine dipstick: NEGATIVE
KETONES UR: NEGATIVE mg/dL
Nitrite: NEGATIVE
PROTEIN: NEGATIVE mg/dL
Specific Gravity, Urine: 1.02 (ref 1.005–1.030)
UROBILINOGEN UA: 0.2 mg/dL (ref 0.0–1.0)
pH: 6.5 (ref 5.0–8.0)

## 2015-10-25 MED ORDER — ACCU-CHEK AVIVA PLUS W/DEVICE KIT: PACK | Status: DC

## 2015-10-25 MED ORDER — GLUCOSE BLOOD VI STRP
ORAL_STRIP | Status: DC
Start: 2015-10-25 — End: 2015-11-13

## 2015-10-25 MED ORDER — ACCU-CHEK FASTCLIX LANCETS MISC: Status: DC

## 2015-10-25 NOTE — Progress Notes (Signed)
Pt is requesting diabetes supplies be called into her pharmacy.

## 2015-10-25 NOTE — Progress Notes (Signed)
Subjective:  Madison Bowman is a 30 y.o. G4P0030 at 3833w0d being seen today for ongoing prenatal care.  She is currently monitored for the following issues for this high-risk pregnancy and has Supervision of high-risk pregnancy; Tobacco smoking complicating pregnancy in first trimester; and Gestational diabetes mellitus, antepartum on her problem list.  Patient reports no complaints.  Contractions: Not present. Vag. Bleeding: None.  Movement: Present. Denies leaking of fluid.   The following portions of the patient's history were reviewed and updated as appropriate: allergies, current medications, past family history, past medical history, past social history, past surgical history and problem list. Problem list updated.  Objective:   Filed Vitals:   10/25/15 1137  BP: 113/69  Pulse: 66  Weight: 158 lb 12.8 oz (72.031 kg)    Fetal Status: Fetal Heart Rate (bpm): 150 Fundal Height: 32 cm Movement: Present     General:  Alert, oriented and cooperative. Patient is in no acute distress.  Skin: Skin is warm and dry. No rash noted.   Cardiovascular: Normal heart rate noted  Respiratory: Normal respiratory effort, no problems with respiration noted  Abdomen: Soft, gravid, appropriate for gestational age. Pain/Pressure: Absent     Pelvic: Cervical exam deferred        Extremities: Normal range of motion.  Edema: None  Mental Status: Normal mood and affect. Normal behavior. Normal judgment and thought content.   Urinalysis: Urine Protein: Negative Urine Glucose: Negative  Assessment and Plan:  Pregnancy: G4P0030 at 5333w0d  1. Gestational diabetes mellitus, antepartum - Plans to pick up supplies today - Follow up visit on 11/06/15   2. Supervision of high-risk pregnancy, third trimester - Growth ultrasound today, results not available  Preterm labor symptoms and general obstetric precautions including but not limited to vaginal bleeding, contractions, leaking of fluid and fetal movement were  reviewed in detail with the patient. Please refer to After Visit Summary for other counseling recommendations.   Follow-up on 11/06/15  Marlis EdelsonWalidah N Karim, CNM

## 2015-11-06 ENCOUNTER — Ambulatory Visit (INDEPENDENT_AMBULATORY_CARE_PROVIDER_SITE_OTHER): Payer: BLUE CROSS/BLUE SHIELD | Admitting: Obstetrics & Gynecology

## 2015-11-06 VITALS — BP 117/68 | HR 60 | Wt 160.3 lb

## 2015-11-06 DIAGNOSIS — O24419 Gestational diabetes mellitus in pregnancy, unspecified control: Secondary | ICD-10-CM

## 2015-11-06 DIAGNOSIS — O0993 Supervision of high risk pregnancy, unspecified, third trimester: Secondary | ICD-10-CM

## 2015-11-06 LAB — GLUCOSE, CAPILLARY: Glucose-Capillary: 75 mg/dL (ref 65–99)

## 2015-11-06 LAB — POCT URINALYSIS DIP (DEVICE)
Bilirubin Urine: NEGATIVE
Glucose, UA: NEGATIVE mg/dL
HGB URINE DIPSTICK: NEGATIVE
KETONES UR: NEGATIVE mg/dL
Nitrite: NEGATIVE
Protein, ur: NEGATIVE mg/dL
SPECIFIC GRAVITY, URINE: 1.02 (ref 1.005–1.030)
UROBILINOGEN UA: 0.2 mg/dL (ref 0.0–1.0)
pH: 7 (ref 5.0–8.0)

## 2015-11-06 NOTE — Progress Notes (Signed)
Subjective:interested in waterbirth  Madison Bowman is a 30 y.o. G4P0030 at 753w5d being seen today for ongoing prenatal care.  She is currently monitored for the following issues for this high-risk pregnancy and has Supervision of high-risk pregnancy; Tobacco smoking complicating pregnancy in third trimester; and Gestational diabetes mellitus, antepartum on her problem list.  Patient reports no complaints.  Contractions: Not present. Vag. Bleeding: None.  Movement: Present. Denies leaking of fluid.   The following portions of the patient's history were reviewed and updated as appropriate: allergies, current medications, past family history, past medical history, past social history, past surgical history and problem list. Problem list updated.  Objective:   Filed Vitals:   11/06/15 1135  BP: 117/68  Pulse: 60  Weight: 160 lb 4.8 oz (72.712 kg)    Fetal Status: Fetal Heart Rate (bpm): 134   Movement: Present     General:  Alert, oriented and cooperative. Patient is in no acute distress.  Skin: Skin is warm and dry. No rash noted.   Cardiovascular: Normal heart rate noted  Respiratory: Normal respiratory effort, no problems with respiration noted  Abdomen: Soft, gravid, appropriate for gestational age. Pain/Pressure: Absent     Pelvic:  Cervical exam deferred        Extremities: Normal range of motion.  Edema: None  Mental Status: Normal mood and affect. Normal behavior. Normal judgment and thought content.   Urinalysis: Urine Protein: Negative Urine Glucose: Negative  Assessment and Plan:  Pregnancy: G4P0030 at 123w5d  1. Gestational diabetes mellitus, antepartum Needs her testing supplies  2. Supervision of high-risk pregnancy, third trimester   Preterm labor symptoms and general obstetric precautions including but not limited to vaginal bleeding, contractions, leaking of fluid and fetal movement were reviewed in detail with the patient. Please refer to After Visit Summary for  other counseling recommendations.  2 week f/u   Adam PhenixJames G Arnold, MD

## 2015-11-06 NOTE — Progress Notes (Signed)
Urine: small amt wbcs Has not gotten diabetes supplies d/t insurance issues

## 2015-11-06 NOTE — Patient Instructions (Signed)
Pregnancy and Smoking Smoking during pregnancy is unhealthy for you and your developing baby. The addictive drug nicotine, carbon monoxide, and many other poisons are inhaled from a cigarette and carried through your bloodstream to your baby. Cigarette smoke contains more than 2,500 chemicals. It is not known which of these are harmful to a developing baby. However, both nicotine and carbon monoxide play a role in causing health problems in pregnancy. Smoking during pregnancy increases the risk of:  Birth defects in your baby, including heart defects.  Miscarriage and stillbirth.  Birth before 37 completed weeks of pregnancy (premature birth).  Pregnancy outside of the uterus (tubal pregnancy).  Attachment of the placenta over the opening of the uterus (placenta previa).  Detachment of the placenta before the baby's birth (placental abruption).  Breaking of the bag of waters before labor begins (premature rupture of membranes). HOW DOES SMOKING DURING PREGNANCY AFFECT MY BABY? Before Birth Smoking during pregnancy:  Decreases blood flow and oxygen to your baby.  Increases the heart rate of your baby.  Slows your baby's growth in the uterus (intrauterine growth retardation). After Birth Babies born to women who smoke during pregnancy are more likely to have a low birth weight. They are also at risk for:  Serious health problems, chronic or lifelong disabilities (cerebral palsy, mental retardation, learning problems), and death.  Sudden infant death syndrome (SIDS).  Lung and breathing problems. WHAT RESOURCES ARE AVAILABLE TO HELP ME STOP SMOKING?  Ask your health care provider for help to stop smoking. The following resources are available:  Counseling.  Psychological treatment.  Acupuncture.  Family intervention.  Hypnosis.  Nicotine supplements have not been studied enough to know if they are safe to use during pregnancy. They should only be considered when all other  methods fail, and if used under the close supervision of your health care provider.  Telephone QUIT lines. The national smoking cessation telephone hotline number is 1-800-QUIT NOW. FOR MORE INFORMATION  American Cancer Society: www.cancer.org  American Heart Association: www.heart.org  National Cancer Institute: www.cancer.gov  March of Dimes: www.marchofdimes.org   This information is not intended to replace advice given to you by your health care provider. Make sure you discuss any questions you have with your health care provider.   Document Released: 08/27/2004 Document Revised: 04/20/2013 Document Reviewed: 03/15/2013 Elsevier Interactive Patient Education 2016 Elsevier Inc.  

## 2015-11-13 ENCOUNTER — Ambulatory Visit (INDEPENDENT_AMBULATORY_CARE_PROVIDER_SITE_OTHER): Payer: BLUE CROSS/BLUE SHIELD | Admitting: Family Medicine

## 2015-11-13 VITALS — BP 119/75 | HR 81 | Wt 160.0 lb

## 2015-11-13 DIAGNOSIS — O24410 Gestational diabetes mellitus in pregnancy, diet controlled: Secondary | ICD-10-CM | POA: Diagnosis not present

## 2015-11-13 DIAGNOSIS — O0993 Supervision of high risk pregnancy, unspecified, third trimester: Secondary | ICD-10-CM

## 2015-11-13 LAB — POCT URINALYSIS DIP (DEVICE)
BILIRUBIN URINE: NEGATIVE
Glucose, UA: NEGATIVE mg/dL
Ketones, ur: NEGATIVE mg/dL
NITRITE: NEGATIVE
PH: 6.5 (ref 5.0–8.0)
Protein, ur: NEGATIVE mg/dL
Specific Gravity, Urine: 1.015 (ref 1.005–1.030)
Urobilinogen, UA: 0.2 mg/dL (ref 0.0–1.0)

## 2015-11-13 MED ORDER — GLUCOSE BLOOD VI STRP
ORAL_STRIP | Status: DC
Start: 2015-11-13 — End: 2015-12-01

## 2015-11-13 MED ORDER — BAYER CONTOUR MONITOR W/DEVICE KIT: 1.0000 | PACK | Freq: Once | Status: DC

## 2015-11-13 MED ORDER — BAYER MICROLET LANCETS MISC: Status: DC

## 2015-11-13 NOTE — Progress Notes (Signed)
Subjective:  Madison Bowman is a 29 y.o. G4P0030 at [redacted]w[redacted]d being seen today for ongoing prenatal care.  She is currently monitored for the following issues for this high-risk pregnancy and has Supervision of high-risk pregnancy; Tobacco smoking complicating pregnancy in third trimester; and Gestational diabetes mellitus, antepartum on her problem list.  Patient reports leg cramps.  Contractions: Not present. Vag. Bleeding: None.  Movement: Present. Denies leaking of fluid.   The following portions of the patient's history were reviewed and updated as appropriate: allergies, current medications, past family history, past medical history, past social history, past surgical history and problem list. Problem list updated.  Objective:   Filed Vitals:   11/13/15 0903  BP: 119/75  Pulse: 81  Weight: 160 lb (72.576 kg)    Fetal Status: Fetal Heart Rate (bpm): 142   Movement: Present     General:  Alert, oriented and cooperative. Patient is in no acute distress.  Skin: Skin is warm and dry. No rash noted.   Cardiovascular: Normal heart rate noted  Respiratory: Normal respiratory effort, no problems with respiration noted  Abdomen: Soft, gravid, appropriate for gestational age. Pain/Pressure: Present     Pelvic:  Cervical exam deferred        Extremities: Normal range of motion.  Edema: None  Mental Status: Normal mood and affect. Normal behavior. Normal judgment and thought content.   Urinalysis: Urine Protein: Negative Urine Glucose: Negative  Assessment and Plan:  Pregnancy: G4P0030 at [redacted]w[redacted]d  1. Supervision of high-risk pregnancy, third trimester FHT and FH normal  2. Diet controlled gestational diabetes mellitus in third trimester Hasn't received glucometer due to insurance.  New glucometer prescribed.  Should get it today.  Return in 1 week to check CBGs.  - glucose blood test strip; Use as instructed  Dispense: 100 each; Refill: 12 - Blood Glucose Monitoring Suppl ([employer] CONTOUR  MONITOR) w/Device KIT; 1 Device by Does not apply route once.  Dispense: 1 kit; Refill: 0 - BAYER MICROLET LANCETS lancets; Use as instructed  Dispense: 100 each; Refill: 12  Preterm labor symptoms and general obstetric precautions including but not limited to vaginal bleeding, contractions, leaking of fluid and fetal movement were reviewed in detail with the patient. Please refer to After Visit Summary for other counseling recommendations.  Return in about 1 week (around 11/20/2015).   Jacob J Stinson, DO  

## 2015-11-27 ENCOUNTER — Ambulatory Visit (INDEPENDENT_AMBULATORY_CARE_PROVIDER_SITE_OTHER): Payer: BLUE CROSS/BLUE SHIELD | Admitting: Obstetrics and Gynecology

## 2015-11-27 VITALS — BP 116/75 | HR 70 | Wt 161.7 lb

## 2015-11-27 DIAGNOSIS — O0993 Supervision of high risk pregnancy, unspecified, third trimester: Secondary | ICD-10-CM | POA: Diagnosis not present

## 2015-11-27 DIAGNOSIS — O99333 Smoking (tobacco) complicating pregnancy, third trimester: Secondary | ICD-10-CM

## 2015-11-27 DIAGNOSIS — O24419 Gestational diabetes mellitus in pregnancy, unspecified control: Secondary | ICD-10-CM

## 2015-11-27 LAB — POCT URINALYSIS DIP (DEVICE)
Bilirubin Urine: NEGATIVE
Glucose, UA: NEGATIVE mg/dL
Hgb urine dipstick: NEGATIVE
KETONES UR: NEGATIVE mg/dL
Nitrite: NEGATIVE
PH: 6.5 (ref 5.0–8.0)
PROTEIN: NEGATIVE mg/dL
Specific Gravity, Urine: 1.015 (ref 1.005–1.030)
UROBILINOGEN UA: 0.2 mg/dL (ref 0.0–1.0)

## 2015-11-27 LAB — OB RESULTS CONSOLE GC/CHLAMYDIA: GC PROBE AMP, GENITAL: NEGATIVE

## 2015-11-27 LAB — OB RESULTS CONSOLE GBS: GBS: NEGATIVE

## 2015-11-27 LAB — GLUCOSE, CAPILLARY: Glucose-Capillary: 124 mg/dL — ABNORMAL HIGH (ref 65–99)

## 2015-11-27 NOTE — Progress Notes (Signed)
Subjective:  Madison Bowman is a 30 y.o. G4P0030 at 104w5d being seen today for ongoing prenatal care.  She is currently monitored for the following issues for this high-risk pregnancy and has Supervision of high-risk pregnancy; Tobacco smoking complicating pregnancy in third trimester; and Gestational diabetes mellitus, antepartum on her problem list.  Patient reports no complaints.   .  .  Movement: Present. Denies leaking of fluid.   The following portions of the patient's history were reviewed and updated as appropriate: allergies, current medications, past family history, past medical history, past social history, past surgical history and problem list. Problem list updated.  Objective:   Vitals:   11/27/15 1554  BP: 116/75  Pulse: 70  Weight: 161 lb 11.2 oz (73.3 kg)    Fetal Status: Fetal Heart Rate (bpm): 151 Fundal Height: 34 cm Movement: Present     General:  Alert, oriented and cooperative. Patient is in no acute distress.  Skin: Skin is warm and dry. No rash noted.   Cardiovascular: Normal heart rate noted  Respiratory: Normal respiratory effort, no problems with respiration noted  Abdomen: Soft, gravid, appropriate for gestational age. Pain/Pressure: Present     Pelvic:  Cervical exam performed Dilation: Closed Effacement (%): Thick Station: Ballotable  Extremities: Normal range of motion.     Mental Status: Normal mood and affect. Normal behavior. Normal judgment and thought content.   Urinalysis: Urine Protein: Negative Urine Glucose: Negative  Assessment and Plan:  Pregnancy: G4P0030 at [redacted]w[redacted]d  1. Gestational diabetes mellitus, antepartum Patient has not been checking CBGs since diagnosis as she is not able to obtain her lancets. Will check CBG now which is 3 hours pp- 124 Stressed the importance of checking CBG to ensure euglycemia in pregnancy  2. Tobacco smoking complicating pregnancy in third trimester   3. Supervision of high-risk pregnancy, third  trimester Cultures collected - GC/Chlamydia probe amp ([place])not at [facility] - Culture, beta strep (group b only)  Preterm labor symptoms and general obstetric precautions including but not limited to vaginal bleeding, contractions, leaking of fluid and fetal movement were reviewed in detail with the patient. Please refer to After Visit Summary for other counseling recommendations.  Return in about 1 week (around 12/04/2015).   Catalina Antigua, MD

## 2015-11-28 LAB — GC/CHLAMYDIA PROBE AMP (~~LOC~~) NOT AT ARMC
CHLAMYDIA, DNA PROBE: NEGATIVE
Neisseria Gonorrhea: NEGATIVE

## 2015-11-29 ENCOUNTER — Ambulatory Visit (HOSPITAL_COMMUNITY)
Admission: RE | Admit: 2015-11-29 | Discharge: 2015-11-29 | Disposition: A | Discharge status: Home / Self Care | Payer: BLUE CROSS/BLUE SHIELD | Source: Ambulatory Visit | Attending: Obstetrics & Gynecology | Admitting: Obstetrics & Gynecology

## 2015-11-29 ENCOUNTER — Encounter (HOSPITAL_COMMUNITY): Payer: Self-pay

## 2015-11-29 ENCOUNTER — Other Ambulatory Visit (HOSPITAL_COMMUNITY): Payer: Self-pay | Admitting: Maternal and Fetal Medicine

## 2015-11-29 VITALS — BP 117/75 | HR 67 | Wt 161.6 lb

## 2015-11-29 DIAGNOSIS — O24410 Gestational diabetes mellitus in pregnancy, diet controlled: Secondary | ICD-10-CM | POA: Diagnosis not present

## 2015-11-29 DIAGNOSIS — O24419 Gestational diabetes mellitus in pregnancy, unspecified control: Secondary | ICD-10-CM

## 2015-11-29 DIAGNOSIS — O365930 Maternal care for other known or suspected poor fetal growth, third trimester, not applicable or unspecified: Secondary | ICD-10-CM | POA: Diagnosis not present

## 2015-11-29 DIAGNOSIS — Z3A36 36 weeks gestation of pregnancy: Secondary | ICD-10-CM | POA: Insufficient documentation

## 2015-11-29 DIAGNOSIS — IMO0002 Reserved for concepts with insufficient information to code with codable children: Secondary | ICD-10-CM

## 2015-11-30 LAB — CULTURE, BETA STREP (GROUP B ONLY)

## 2015-12-01 ENCOUNTER — Telehealth: Payer: Self-pay | Admitting: *Deleted

## 2015-12-01 DIAGNOSIS — O24410 Gestational diabetes mellitus in pregnancy, diet controlled: Secondary | ICD-10-CM

## 2015-12-01 MED ORDER — BAYER MICROLET LANCETS MISC: 12 refills | Status: DC

## 2015-12-01 MED ORDER — BAYER CONTOUR MONITOR W/DEVICE KIT: 1.0000 | PACK | Freq: Once | 0 refills | Status: AC

## 2015-12-01 MED ORDER — GLUCOSE BLOOD VI STRP: ORAL_STRIP | 12 refills | Status: DC

## 2015-12-01 NOTE — Telephone Encounter (Signed)
Contour glucose test kit, strips and lancets were ordered for this patient on 7/17. Phoned patient's pharmacy to verify they had correct order. They only has an order from June for the [employer]. Informed them that I am reordering the contour supplies. Called patient, advised that the pharmacy should now have the correct supplies and she should be able to pick them up later today. Patient voiced understanding.

## 2015-12-01 NOTE — Telephone Encounter (Signed)
Received a message left on nurse voicemail on 11/30/15 at 1215.  Patient states she needs a prescription for contour blood glucose machine, strips and lancets.  States this is the machine her insurance covers.  Requests a return call to [phone].

## 2015-12-04 ENCOUNTER — Ambulatory Visit (INDEPENDENT_AMBULATORY_CARE_PROVIDER_SITE_OTHER): Payer: BLUE CROSS/BLUE SHIELD | Admitting: Family Medicine

## 2015-12-04 VITALS — BP 126/80 | HR 72 | Wt 161.9 lb

## 2015-12-04 DIAGNOSIS — O99333 Smoking (tobacco) complicating pregnancy, third trimester: Secondary | ICD-10-CM

## 2015-12-04 DIAGNOSIS — O24419 Gestational diabetes mellitus in pregnancy, unspecified control: Secondary | ICD-10-CM

## 2015-12-04 DIAGNOSIS — IMO0002 Reserved for concepts with insufficient information to code with codable children: Secondary | ICD-10-CM | POA: Insufficient documentation

## 2015-12-04 DIAGNOSIS — O0993 Supervision of high risk pregnancy, unspecified, third trimester: Secondary | ICD-10-CM

## 2015-12-04 LAB — POCT URINALYSIS DIP (DEVICE)
BILIRUBIN URINE: NEGATIVE
Glucose, UA: NEGATIVE mg/dL
HGB URINE DIPSTICK: NEGATIVE
KETONES UR: NEGATIVE mg/dL
Nitrite: NEGATIVE
PH: 7 (ref 5.0–8.0)
PROTEIN: NEGATIVE mg/dL
Specific Gravity, Urine: 1.015 (ref 1.005–1.030)
Urobilinogen, UA: 0.2 mg/dL (ref 0.0–1.0)

## 2015-12-04 LAB — GLUCOSE, CAPILLARY: GLUCOSE-CAPILLARY: 79 mg/dL (ref 65–99)

## 2015-12-04 NOTE — Progress Notes (Signed)
Subjective:    Madison Bowman is a 30 y.o. female being seen today for her obstetrical visit. She is at 164w5d gestation. Patient reports no complaints. Fetal movement: normal.  Follows with MFM every Wed for BPP/Dopplers.   Smokes 4 cigarettes per day. Decreased from 1/2 ppd recently due to IUGR.   Lancets arrive today. Has been unable to check CBGs at home. Diagnosed 6 weeks ago. Has completed DM education. Decreased sugar/carb intake. More protein in diet. Ate a single cracker at 8:45 am. 2 hour post snack/fasting today is 79.  Review of Systems:   Review of Systems REVIEW OF SYSTEMS  OPHTHALMIC: negative for - blurry vision, decreased vision, double vision, photophobia or scotomata RESPIRATORY: no cough, shortness of breath, or wheezing CARDIOVASCULAR: no chest pain or dyspnea on exertion GASTROINTESTINAL: no abdominal pain, change in bowel habits, or black or bloody stools negative for - epigastric or RUQ pain GENITO-URINARY: no dysuria, trouble voiding, or hematuria negative for - genital discharge, vulvar/vaginal symptoms or vaginal bleeding MUSKULOSKELETAL: negative for - gait disturbance or swelling in ankle - bilateral, foot - bilateral and leg - bilateral NEUROLOGICAL: negative for - dizziness, gait disturbance, headaches, numbness/tingling or visual changes DERMATOLOGICAL: negative OBSTETRICAL: No vaginal bleeding, no leaking fluid, no contractions. Good fetal movement.   Objective:    BP 126/80   Pulse 72   Wt 161 lb 14.4 oz (73.4 kg)   LMP  (LMP Unknown)   BMI 30.59 kg/m   Physical Exam  Exam  FHT:  147 BPM  Uterine Size: size less than dates  Presentation: cephalic     Assessment:    Pregnancy:  G4P0030 at 36 5/[redacted] weeks EGA    Plan:    Patient Active Problem List   Diagnosis Date Noted  . IUGR (intrauterine growth restriction) 12/04/2015  . Gestational diabetes mellitus, antepartum 10/16/2015  . Tobacco smoking complicating pregnancy in third  trimester 07/12/2015  . Supervision of high-risk pregnancy 06/01/2015   1. Supervision of high-risk pregnancy, third trimester - Continue BPP/Cord Dopplers  2. Gestational diabetes mellitus, antepartum - POCT fasting today is 6479 - GDMA1  3. Tobacco smoking complicating pregnancy in third trimester - Down to 4 cig/day  4. IUGR (intrauterine growth restriction) - Recommend delivery between 38-39 weeks if normal BPPs and Dopplers - < 10% growth, <4% AC, normal AFI, normal Dopplers   Cigarette smoking: 4 cig/day. Reviewed kick counts with patient and when to call in or seek care. Follow up in 1 Week.     Cleda ClarksElizabeth W Shalandria Elsbernd, DO OB Fellow 10:22 AM

## 2015-12-04 NOTE — Progress Notes (Signed)
States not taking prenatal vitamins due to superstitious about taking them previously and she had 2 losses. Discussed taking flintstones instead.

## 2015-12-04 NOTE — Patient Instructions (Signed)
Fetal Movement Counts Patient Name: __________________________________________________ Patient Due Date: ____________________ Performing a fetal movement count is highly recommended in high-risk pregnancies, but it is good for every pregnant woman to do. Your health care provider may ask you to start counting fetal movements at 28 weeks of the pregnancy. Fetal movements often increase:  After eating a full meal.  After physical activity.  After eating or drinking something sweet or cold.  At rest. Pay attention to when you feel the baby is most active. This will help you notice a pattern of your baby's sleep and wake cycles and what factors contribute to an increase in fetal movement. It is important to perform a fetal movement count at the same time each day when your baby is normally most active.  HOW TO COUNT FETAL MOVEMENTS 1. Find a quiet and comfortable area to sit or lie down on your left side. Lying on your left side provides the best blood and oxygen circulation to your baby. 2. Write down the day and time on a sheet of paper or in a journal. 3. Start counting kicks, flutters, swishes, rolls, or jabs in a 2-hour period. You should feel at least 10 movements within 2 hours. 4. If you do not feel 10 movements in 2 hours, wait 2-3 hours and count again. Look for a change in the pattern or not enough counts in 2 hours. SEEK MEDICAL CARE IF:  You feel less than 10 counts in 2 hours, tried twice.  There is no movement in over an hour.  The pattern is changing or taking longer each day to reach 10 counts in 2 hours.  You feel the baby is not moving as he or she usually does. Date: ____________ Movements: ____________ Start time: ____________ Finish time: ____________  Date: ____________ Movements: ____________ Start time: ____________ Finish time: ____________ Date: ____________ Movements: ____________ Start time: ____________ Finish time: ____________ Date: ____________ Movements:  ____________ Start time: ____________ Finish time: ____________ Date: ____________ Movements: ____________ Start time: ____________ Finish time: ____________ Date: ____________ Movements: ____________ Start time: ____________ Finish time: ____________ Date: ____________ Movements: ____________ Start time: ____________ Finish time: ____________ Date: ____________ Movements: ____________ Start time: ____________ Finish time: ____________  Date: ____________ Movements: ____________ Start time: ____________ Finish time: ____________ Date: ____________ Movements: ____________ Start time: ____________ Finish time: ____________ Date: ____________ Movements: ____________ Start time: ____________ Finish time: ____________ Date: ____________ Movements: ____________ Start time: ____________ Finish time: ____________ Date: ____________ Movements: ____________ Start time: ____________ Finish time: ____________ Date: ____________ Movements: ____________ Start time: ____________ Finish time: ____________ Date: ____________ Movements: ____________ Start time: ____________ Finish time: ____________  Date: ____________ Movements: ____________ Start time: ____________ Finish time: ____________ Date: ____________ Movements: ____________ Start time: ____________ Finish time: ____________ Date: ____________ Movements: ____________ Start time: ____________ Finish time: ____________ Date: ____________ Movements: ____________ Start time: ____________ Finish time: ____________ Date: ____________ Movements: ____________ Start time: ____________ Finish time: ____________ Date: ____________ Movements: ____________ Start time: ____________ Finish time: ____________ Date: ____________ Movements: ____________ Start time: ____________ Finish time: ____________  Date: ____________ Movements: ____________ Start time: ____________ Finish time: ____________ Date: ____________ Movements: ____________ Start time: ____________ Finish  time: ____________ Date: ____________ Movements: ____________ Start time: ____________ Finish time: ____________ Date: ____________ Movements: ____________ Start time: ____________ Finish time: ____________ Date: ____________ Movements: ____________ Start time: ____________ Finish time: ____________ Date: ____________ Movements: ____________ Start time: ____________ Finish time: ____________ Date: ____________ Movements: ____________ Start time: ____________ Finish time: ____________  Date: ____________ Movements: ____________ Start time: ____________ Finish   time: ____________ Date: ____________ Movements: ____________ Start time: ____________ Doreatha Martin time: ____________ Date: ____________ Movements: ____________ Start time: ____________ Doreatha Martin time: ____________ Date: ____________ Movements: ____________ Start time: ____________ Doreatha Martin time: ____________ Date: ____________ Movements: ____________ Start time: ____________ Doreatha Martin time: ____________ Date: ____________ Movements: ____________ Start time: ____________ Doreatha Martin time: ____________ Date: ____________ Movements: ____________ Start time: ____________ Doreatha Martin time: ____________  Date: ____________ Movements: ____________ Start time: ____________ Doreatha Martin time: ____________ Date: ____________ Movements: ____________ Start time: ____________ Doreatha Martin time: ____________ Date: ____________ Movements: ____________ Start time: ____________ Doreatha Martin time: ____________ Date: ____________ Movements: ____________ Start time: ____________ Doreatha Martin time: ____________ Date: ____________ Movements: ____________ Start time: ____________ Doreatha Martin time: ____________ Date: ____________ Movements: ____________ Start time: ____________ Doreatha Martin time: ____________ Date: ____________ Movements: ____________ Start time: ____________ Doreatha Martin time: ____________  Date: ____________ Movements: ____________ Start time: ____________ Doreatha Martin time: ____________ Date: ____________  Movements: ____________ Start time: ____________ Doreatha Martin time: ____________ Date: ____________ Movements: ____________ Start time: ____________ Doreatha Martin time: ____________ Date: ____________ Movements: ____________ Start time: ____________ Doreatha Martin time: ____________ Date: ____________ Movements: ____________ Start time: ____________ Doreatha Martin time: ____________ Date: ____________ Movements: ____________ Start time: ____________ Doreatha Martin time: ____________ Date: ____________ Movements: ____________ Start time: ____________ Doreatha Martin time: ____________  Date: ____________ Movements: ____________ Start time: ____________ Doreatha Martin time: ____________ Date: ____________ Movements: ____________ Start time: ____________ Doreatha Martin time: ____________ Date: ____________ Movements: ____________ Start time: ____________ Doreatha Martin time: ____________ Date: ____________ Movements: ____________ Start time: ____________ Doreatha Martin time: ____________ Date: ____________ Movements: ____________ Start time: ____________ Doreatha Martin time: ____________ Date: ____________ Movements: ____________ Start time: ____________ Doreatha Martin time: ____________   This information is not intended to replace advice given to you by your health care provider. Make sure you discuss any questions you have with your health care provider.    Third Trimester of Pregnancy The third trimester is from week 29 through week 42, months 7 through 9. The third trimester is a time when the fetus is growing rapidly. At the end of the ninth month, the fetus is about 20 inches in length and weighs 6-10 pounds.  BODY CHANGES Your body goes through many changes during pregnancy. The changes vary from woman to woman.   Your weight will continue to increase. You can expect to gain 25-35 pounds (11-16 kg) by the end of the pregnancy.  You may begin to get stretch marks on your hips, abdomen, and breasts.  You may urinate more often because the fetus is moving lower into your pelvis  and pressing on your bladder.  You may develop or continue to have heartburn as a result of your pregnancy.  You may develop constipation because certain hormones are causing the muscles that push waste through your intestines to slow down.  You may develop hemorrhoids or swollen, bulging veins (varicose veins).  You may have pelvic pain because of the weight gain and pregnancy hormones relaxing your joints between the bones in your pelvis. Backaches may result from overexertion of the muscles supporting your posture.  You may have changes in your hair. These can include thickening of your hair, rapid growth, and changes in texture. Some women also have hair loss during or after pregnancy, or hair that feels dry or thin. Your hair will most likely return to normal after your baby is born.  Your breasts will continue to grow and be tender. A yellow discharge may leak from your breasts called colostrum.  Your belly button may stick out.  You may feel short of breath because of your expanding uterus.  You may notice  the fetus "dropping," or moving lower in your abdomen.  You may have a bloody mucus discharge. This usually occurs a few days to a week before labor begins.  Your cervix becomes thin and soft (effaced) near your due date. WHAT TO EXPECT AT YOUR PRENATAL EXAMS  You will have prenatal exams every 2 weeks until week 36. Then, you will have weekly prenatal exams. During a routine prenatal visit:  You will be weighed to make sure you and the fetus are growing normally.  Your blood pressure is taken.  Your abdomen will be measured to track your baby's growth.  The fetal heartbeat will be listened to.  Any test results from the previous visit will be discussed.  You may have a cervical check near your due date to see if you have effaced. At around 36 weeks, your caregiver will check your cervix. At the same time, your caregiver will also perform a test on the secretions of the  vaginal tissue. This test is to determine if a type of bacteria, Group B streptococcus, is present. Your caregiver will explain this further. Your caregiver may ask you:  What your birth plan is.  How you are feeling.  If you are feeling the baby move.  If you have had any abnormal symptoms, such as leaking fluid, bleeding, severe headaches, or abdominal cramping.  If you are using any tobacco products, including cigarettes, chewing tobacco, and electronic cigarettes.  If you have any questions. Other tests or screenings that may be performed during your third trimester include:  Blood tests that check for low iron levels (anemia).  Fetal testing to check the health, activity level, and growth of the fetus. Testing is done if you have certain medical conditions or if there are problems during the pregnancy.  HIV (human immunodeficiency virus) testing. If you are at high risk, you may be screened for HIV during your third trimester of pregnancy. FALSE LABOR You may feel small, irregular contractions that eventually go away. These are called Braxton Hicks contractions, or false labor. Contractions may last for hours, days, or even weeks before true labor sets in. If contractions come at regular intervals, intensify, or become painful, it is best to be seen by your caregiver.  SIGNS OF LABOR   Menstrual-like cramps.  Contractions that are 5 minutes apart or less.  Contractions that start on the top of the uterus and spread down to the lower abdomen and back.  A sense of increased pelvic pressure or back pain.  A watery or bloody mucus discharge that comes from the vagina. If you have any of these signs before the 37th week of pregnancy, call your caregiver right away. You need to go to the hospital to get checked immediately. HOME CARE INSTRUCTIONS   Avoid all smoking, herbs, alcohol, and unprescribed drugs. These chemicals affect the formation and growth of the baby.  Do not use  any tobacco products, including cigarettes, chewing tobacco, and electronic cigarettes. If you need help quitting, ask your health care provider. You may receive counseling support and other resources to help you quit.  Follow your caregiver's instructions regarding medicine use. There are medicines that are either safe or unsafe to take during pregnancy.  Exercise only as directed by your caregiver. Experiencing uterine cramps is a good sign to stop exercising.  Continue to eat regular, healthy meals.  Wear a good support bra for breast tenderness.  Do not use hot tubs, steam rooms, or saunas.  Wear your  seat belt at all times when driving.  Avoid raw meat, uncooked cheese, cat litter boxes, and soil used by cats. These carry germs that can cause birth defects in the baby.  Take your prenatal vitamins.  Take 1500-2000 mg of calcium daily starting at the 20th week of pregnancy until you deliver your baby.  Try taking a stool softener (if your caregiver approves) if you develop constipation. Eat more high-fiber foods, such as fresh vegetables or fruit and whole grains. Drink plenty of fluids to keep your urine clear or pale yellow.  Take warm sitz baths to soothe any pain or discomfort caused by hemorrhoids. Use hemorrhoid cream if your caregiver approves.  If you develop varicose veins, wear support hose. Elevate your feet for 15 minutes, 3-4 times a day. Limit salt in your diet.  Avoid heavy lifting, wear low heal shoes, and practice good posture.  Rest a lot with your legs elevated if you have leg cramps or low back pain.  Visit your dentist if you have not gone during your pregnancy. Use a soft toothbrush to brush your teeth and be gentle when you floss.  A sexual relationship may be continued unless your caregiver directs you otherwise.  Do not travel far distances unless it is absolutely necessary and only with the approval of your caregiver.  Take prenatal classes to  understand, practice, and ask questions about the labor and delivery.  Make a trial run to the hospital.  Pack your hospital bag.  Prepare the baby's nursery.  Continue to go to all your prenatal visits as directed by your caregiver. SEEK MEDICAL CARE IF:  You are unsure if you are in labor or if your water has broken.  You have dizziness.  You have mild pelvic cramps, pelvic pressure, or nagging pain in your abdominal area.  You have persistent nausea, vomiting, or diarrhea.  You have a bad smelling vaginal discharge.  You have pain with urination. SEEK IMMEDIATE MEDICAL CARE IF:   You have a fever.  You are leaking fluid from your vagina.  You have spotting or bleeding from your vagina.  You have severe abdominal cramping or pain.  You have rapid weight loss or gain.  You have shortness of breath with chest pain.  You notice sudden or extreme swelling of your face, hands, ankles, feet, or legs.  You have not felt your baby move in over an hour.  You have severe headaches that do not go away with medicine.  You have vision changes.   This information is not intended to replace advice given to you by your health care provider. Make sure you discuss any questions you have with your health care provider.   Document Released: 04/09/2001 Document Revised: 05/06/2014 Document Reviewed: 06/16/2012 Elsevier Interactive Patient Education 2016 [employer]   Intrauterine Growth Restriction Intrauterine growth restriction (IUGR) is when your baby is not growing normally during your pregnancy. A baby with IUGR is smaller than it should be and may weigh less than normal at birth.  IUGR can result if there is a problem with the organ that supplies your baby with oxygen and nutrition (placenta). Usually, there is no way to prevent this type of problem.  CAUSES  The most common cause of IUGR is a problem with the placenta or umbilical cord that causes your developing baby to  get less oxygen or nutrition than needed. Other causes include:  Poor maternal nutrition.  Chemicals found in substances such as cigarettes, alcohol, and  some illegal drugs.  Some prescription medicines.  Congenital defects.  Genetic disorders.  An infection.  Carrying more than one baby, such as twins or triplets (multiple gestations). RISK FACTORS This condition is more likely to develop in women who:  Are over the age of 41 at the time of delivery (advanced maternal age).  Have medical conditions such as high blood pressure, diabetes, heart or kidney disease, anemia, or conditions that increase the risk for blood clotting.  Live at a very high altitude during pregnancy.  Have a personal history or family history of IUGR.  Take medicines during pregnancy that are linked with congenital disabilities.  Have a personal or family history of a genetic disorder.  Come into contact with infected cat feces (toxoplasmosis).  Come into contact with chickenpox (varicella) or [place] measles (rubella).  Have or are at risk of getting an infectious disease such as syphilis, HIV, or herpes.  Eat poorly during their pregnancy.  Weigh less than 100 pounds.  Have a family history of multiple gestations.  Have had infertility treatments.   Use tobacco, illegal drugs, or drink alcohol during pregnancy. SYMPTOMS IUGR does not cause many symptoms. You might notice that your baby does not move or kick very often. Also, your belly may not be as big as expected for the stage of your pregnancy. DIAGNOSIS This condition is diagnosed with physical and prenatal exams. Your health care provider will measure the size of your baby inside your womb during a routine screening exam using sound waves (ultrasound). Your health care provider will compare the size of your baby to the size of other babies at the same stage of development (gestational age). Your health care provider will diagnose IUGR if  your baby is smaller than 90 percent of all other babies at the same gestational age.  You may also have tests to find the cause of IUGR. These can include:  Having fluid removed from your womb to check for signs of infection or a congenital disability (amniocentesis).  A series of tests to monitor your baby's health (fetal monitoring). TREATMENT  In most cases, treatment for this condition focuses on stopping the cause of your baby's small growth. Your health care providers will monitor your pregnancy closely and help you manage your pregnancy. If this condition is caused by a placenta problem and the baby is not getting enough blood, treatment may include:   Medicine to start labor and deliver your baby early (induction).  Cesarean delivery. In this procedure, your baby is delivered through a cut (incision) in the abdomen and womb (uterus). HOME CARE INSTRUCTIONS   Make sure you are eating enough calories and gaining enough weight.  Eat a balanced diet. Work with a Dealer (dietitian), if needed.   Rest as needed. Try to get at least eight hours of sleep every night.  Do not drink alcohol.   Do not use illegal drugs.   Do not use any tobacco products, including cigarettes, chewing tobacco, or e-cigarettes. If you need help quitting, ask your health care provider.  Talk to your health care provider about steps you can take to avoid infections.  Take medicines, vitamins, and mineral supplements only as directed by your health care provider. Make sure that your health care provider knows about all of the prescribed or over-the-counter medicines, supplements, vitamins, eye drops, and creams that you are using.  Keep all follow-up visits as directed by your health care provider. This is important. SEEK MEDICAL CARE IF:  Your baby is not moving as often as before.   This information is not intended to replace advice given to you by your health care provider. Make  sure you discuss any questions you have with your health care provider.   Document Released: 01/23/2008 Document Revised: 08/30/2014 Document Reviewed: 04/11/2014 Elsevier Interactive Patient Education 2016 [employer]    Document Released: 05/15/2006 Document Revised: 05/06/2014 Document Reviewed: 02/10/2012 [employer] Interactive Patient Education [employer]

## 2015-12-06 ENCOUNTER — Other Ambulatory Visit (HOSPITAL_COMMUNITY): Payer: Self-pay | Admitting: Maternal and Fetal Medicine

## 2015-12-06 ENCOUNTER — Ambulatory Visit (HOSPITAL_COMMUNITY)
Admission: RE | Admit: 2015-12-06 | Discharge: 2015-12-06 | Disposition: A | Discharge status: Home / Self Care | Payer: BLUE CROSS/BLUE SHIELD | Source: Ambulatory Visit | Attending: Obstetrics & Gynecology | Admitting: Obstetrics & Gynecology

## 2015-12-06 ENCOUNTER — Encounter (HOSPITAL_COMMUNITY): Payer: Self-pay

## 2015-12-06 DIAGNOSIS — Z3A37 37 weeks gestation of pregnancy: Secondary | ICD-10-CM | POA: Diagnosis not present

## 2015-12-06 DIAGNOSIS — O365939 Maternal care for other known or suspected poor fetal growth, third trimester, other fetus: Secondary | ICD-10-CM | POA: Diagnosis not present

## 2015-12-06 DIAGNOSIS — O24410 Gestational diabetes mellitus in pregnancy, diet controlled: Secondary | ICD-10-CM

## 2015-12-06 DIAGNOSIS — IMO0002 Reserved for concepts with insufficient information to code with codable children: Secondary | ICD-10-CM

## 2015-12-06 DIAGNOSIS — O365930 Maternal care for other known or suspected poor fetal growth, third trimester, not applicable or unspecified: Secondary | ICD-10-CM | POA: Diagnosis not present

## 2015-12-11 ENCOUNTER — Ambulatory Visit (INDEPENDENT_AMBULATORY_CARE_PROVIDER_SITE_OTHER): Payer: BLUE CROSS/BLUE SHIELD | Admitting: Family Medicine

## 2015-12-11 VITALS — BP 116/82 | HR 85 | Wt 162.1 lb

## 2015-12-11 DIAGNOSIS — O365930 Maternal care for other known or suspected poor fetal growth, third trimester, not applicable or unspecified: Secondary | ICD-10-CM

## 2015-12-11 DIAGNOSIS — O99333 Smoking (tobacco) complicating pregnancy, third trimester: Secondary | ICD-10-CM

## 2015-12-11 DIAGNOSIS — O24419 Gestational diabetes mellitus in pregnancy, unspecified control: Secondary | ICD-10-CM

## 2015-12-11 DIAGNOSIS — IMO0002 Reserved for concepts with insufficient information to code with codable children: Secondary | ICD-10-CM

## 2015-12-11 DIAGNOSIS — O0993 Supervision of high risk pregnancy, unspecified, third trimester: Secondary | ICD-10-CM

## 2015-12-11 LAB — POCT URINALYSIS DIP (DEVICE)
Bilirubin Urine: NEGATIVE
GLUCOSE, UA: NEGATIVE mg/dL
Ketones, ur: NEGATIVE mg/dL
NITRITE: NEGATIVE
PROTEIN: NEGATIVE mg/dL
Specific Gravity, Urine: 1.02 (ref 1.005–1.030)
UROBILINOGEN UA: 0.2 mg/dL (ref 0.0–1.0)
pH: 7 (ref 5.0–8.0)

## 2015-12-11 NOTE — Patient Instructions (Signed)
Intrauterine Growth Restriction Intrauterine growth restriction (IUGR) is when your baby is not growing normally during your pregnancy. A baby with IUGR is smaller than it should be and may weigh less than normal at birth.  IUGR can result if there is a problem with the organ that supplies your baby with oxygen and nutrition (placenta). Usually, there is no way to prevent this type of problem.  CAUSES  The most common cause of IUGR is a problem with the placenta or umbilical cord that causes your developing baby to get less oxygen or nutrition than needed. Other causes include:  Poor maternal nutrition.  Chemicals found in substances such as cigarettes, alcohol, and some illegal drugs.  Some prescription medicines.  Congenital defects.  Genetic disorders.  An infection.  Carrying more than one baby, such as twins or triplets (multiple gestations). RISK FACTORS This condition is more likely to develop in women who:  Are over the age of 66 at the time of delivery (advanced maternal age).  Have medical conditions such as high blood pressure, diabetes, heart or kidney disease, anemia, or conditions that increase the risk for blood clotting.  Live at a very high altitude during pregnancy.  Have a personal history or family history of IUGR.  Take medicines during pregnancy that are linked with congenital disabilities.  Have a personal or family history of a genetic disorder.  Come into contact with infected cat feces (toxoplasmosis).  Come into contact with chickenpox (varicella) or [place] measles (rubella).  Have or are at risk of getting an infectious disease such as syphilis, HIV, or herpes.  Eat poorly during their pregnancy.  Weigh less than 100 pounds.  Have a family history of multiple gestations.  Have had infertility treatments.   Use tobacco, illegal drugs, or drink alcohol during pregnancy. SYMPTOMS IUGR does not cause many symptoms. You might notice that your  baby does not move or kick very often. Also, your belly may not be as big as expected for the stage of your pregnancy. DIAGNOSIS This condition is diagnosed with physical and prenatal exams. Your health care provider will measure the size of your baby inside your womb during a routine screening exam using sound waves (ultrasound). Your health care provider will compare the size of your baby to the size of other babies at the same stage of development (gestational age). Your health care provider will diagnose IUGR if your baby is smaller than 76 percent of all other babies at the same gestational age.  You may also have tests to find the cause of IUGR. These can include:  Having fluid removed from your womb to check for signs of infection or a congenital disability (amniocentesis).  A series of tests to monitor your baby's health (fetal monitoring). TREATMENT  In most cases, treatment for this condition focuses on stopping the cause of your baby's small growth. Your health care providers will monitor your pregnancy closely and help you manage your pregnancy. If this condition is caused by a placenta problem and the baby is not getting enough blood, treatment may include:   Medicine to start labor and deliver your baby early (induction).  Cesarean delivery. In this procedure, your baby is delivered through a cut (incision) in the abdomen and womb (uterus). HOME CARE INSTRUCTIONS   Make sure you are eating enough calories and gaining enough weight.  Eat a balanced diet. Work with a Financial planner (dietitian), if needed.   Rest as needed. Try to get at least eight  hours of sleep every night.  Do not drink alcohol.   Do not use illegal drugs.   Do not use any tobacco products, including cigarettes, chewing tobacco, or e-cigarettes. If you need help quitting, ask your health care provider.  Talk to your health care provider about steps you can take to avoid infections.  Take  medicines, vitamins, and mineral supplements only as directed by your health care provider. Make sure that your health care provider knows about all of the prescribed or over-the-counter medicines, supplements, vitamins, eye drops, and creams that you are using.  Keep all follow-up visits as directed by your health care provider. This is important. SEEK MEDICAL CARE IF:   Your baby is not moving as often as before.   This information is not intended to replace advice given to you by your health care provider. Make sure you discuss any questions you have with your health care provider.  Gestational Diabetes Mellitus Gestational diabetes mellitus, often simply referred to as gestational diabetes, is a type of diabetes that some women develop during pregnancy. In gestational diabetes, the pancreas does not make enough insulin (a hormone), the cells are less responsive to the insulin that is made (insulin resistance), or both.Normally, insulin moves sugars from food into the tissue cells. The tissue cells use the sugars for energy. The lack of insulin or the lack of normal response to insulin causes excess sugars to build up in the blood instead of going into the tissue cells. As a result, high blood sugar (hyperglycemia) develops. The effect of high sugar (glucose) levels can cause many problems.  RISK FACTORS You have an increased chance of developing gestational diabetes if you have a family history of diabetes and also have one or more of the following risk factors:  A body mass index over 30 (obesity).  A previous pregnancy with gestational diabetes.  An older age at the time of pregnancy. If blood glucose levels are kept in the normal range during pregnancy, women can have a healthy pregnancy. If your blood glucose levels are not well controlled, there may be risks to you, your unborn baby (fetus), your labor and delivery, or your newborn baby.  SYMPTOMS  If symptoms are experienced, they are  much like symptoms you would normally expect during pregnancy. The symptoms of gestational diabetes include:   Increased thirst (polydipsia).  Increased urination (polyuria).  Increased urination during the night (nocturia).  Weight loss. This weight loss may be rapid.  Frequent, recurring infections.  Tiredness (fatigue).  Weakness.  Vision changes, such as blurred vision.  Fruity smell to your breath.  Abdominal pain. DIAGNOSIS Diabetes is diagnosed when blood glucose levels are increased. Your blood glucose level may be checked by one or more of the following blood tests:  A fasting blood glucose test. You will not be allowed to eat for at least 8 hours before a blood sample is taken.  A random blood glucose test. Your blood glucose is checked at any time of the day regardless of when you ate.  An oral glucose tolerance test (OGTT). Your blood glucose is measured after you have not eaten (fasted) for 1-3 hours and then after you drink a glucose-containing beverage. Since the hormones that cause insulin resistance are highest at about 24-28 weeks of a pregnancy, an OGTT is usually performed during that time. If you have risk factors, you may be screened for undiagnosed type 2 diabetes at your first prenatal visit. TREATMENT  Gestational diabetes should be  managed first with diet and exercise. Medicines may be added only if they are needed.  You will need to take diabetes medicine or insulin daily to keep blood glucose levels in the desired range.  You will need to match insulin dosing with exercise and healthy food choices. If you have gestational diabetes, your treatment goal is to maintain the following blood glucose levels:  Before meals (preprandial): at or below 95 mg/dL.  After meals (postprandial):  One hour after a meal: at or below 140 mg/dL.  Two hours after a meal: at or below 120 mg/dL. If you have pre-existing type 1 or type 2 diabetes, your treatment goal  is to maintain the following blood glucose levels:  Before meals, at bedtime, and overnight: 60-99 mg/dL.  After meals: peak of 100-129 mg/dL. HOME CARE INSTRUCTIONS   Have your hemoglobin A1c level checked twice a year.  Perform daily blood glucose monitoring as directed by your health care provider. It is common to perform frequent blood glucose monitoring.  Monitor urine ketones when you are ill and as directed by your health care provider.  Take your diabetes medicine and insulin as directed by your health care provider to maintain your blood glucose level in the desired range.  Never run out of diabetes medicine or insulin. It is needed every day.  Adjust insulin based on your intake of carbohydrates. Carbohydrates can raise blood glucose levels but need to be included in your diet. Carbohydrates provide vitamins, minerals, and fiber which are an essential part of a healthy diet. Carbohydrates are found in fruits, vegetables, whole grains, dairy products, legumes, and foods containing added sugars.  Eat healthy foods. Alternate 3 meals with 3 snacks.  Maintain a healthy weight gain. The usual total expected weight gain varies according to your prepregnancy body mass index (BMI).  Carry a medical alert card or wear your medical alert jewelry.  Carry a 15-gram carbohydrate snack with you at all times to treat low blood glucose (hypoglycemia). Some examples of 15-gram carbohydrate snacks include:  Glucose tablets, 3 or 4.  Glucose gel, 15-gram tube.  Raisins, 2 tablespoons (24 g).  Jelly beans, 6.  Animal crackers, 8.  Fruit juice, regular soda, or low-fat milk, 4 ounces (120 mL).  Gummy treats, 9.  Recognize hypoglycemia. Hypoglycemia during pregnancy occurs with blood glucose levels of 60 mg/dL and below. The risk for hypoglycemia increases when fasting or skipping meals, during or after intense exercise, and during sleep. Hypoglycemia symptoms can include:  Tremors or  shakes.  Decreased ability to concentrate.  Sweating.  Increased heart rate.  Headache.  Dry mouth.  Hunger.  Irritability.  Anxiety.  Restless sleep.  Altered speech or coordination.  Confusion.  Treat hypoglycemia promptly. If you are alert and able to safely swallow, follow the 15:15 rule:  Take 15-20 grams of rapid-acting glucose or carbohydrate. Rapid-acting options include glucose gel, glucose tablets, or 4 ounces (120 mL) of fruit juice, regular soda, or low-fat milk.  Check your blood glucose level 15 minutes after taking the glucose.  Take 15-20 grams more of glucose if the repeat blood glucose level is still 70 mg/dL or below.  Eat a meal or snack within 1 hour once blood glucose levels return to normal.  Be alert to polyuria (excess urination) and polydipsia (excess thirst) which are early signs of hyperglycemia. An early awareness of hyperglycemia allows for prompt treatment. Treat hyperglycemia as directed by your health care provider.  Engage in at least 30 minutes  of physical activity a day or as directed by your health care provider. Ten minutes of physical activity timed 30 minutes after each meal is encouraged to control postprandial blood glucose levels.  Adjust your insulin dosing and food intake as needed if you start a new exercise or sport.  Follow your sick-day plan at any time you are unable to eat or drink as usual.  Avoid tobacco and alcohol use.  Keep all follow-up visits as directed by your health care provider.  Follow the advice of your health care provider regarding your prenatal and post-delivery (postpartum) appointments, meal planning, exercise, medicines, vitamins, blood tests, other medical tests, and physical activities.  Perform daily skin and foot care. Examine your skin and feet daily for cuts, bruises, redness, nail problems, bleeding, blisters, or sores.  Brush your teeth and gums at least twice a day and floss at least once  a day. Follow up with your dentist regularly.  Schedule an eye exam during the first trimester of your pregnancy or as directed by your health care provider.  Share your diabetes management plan with your workplace or school.  Stay up-to-date with immunizations.  Learn to manage stress.  Obtain ongoing diabetes education and support as needed.  Learn about and consider breastfeeding your baby.  You should have your blood sugar level checked 6-12 weeks after delivery. This is done with an oral glucose tolerance test (OGTT). SEEK MEDICAL CARE IF:   You are unable to eat food or drink fluids for more than 6 hours.  You have nausea and vomiting for more than 6 hours.  You have a blood glucose level of 200 mg/dL and you have ketones in your urine.  There is a change in mental status.  You develop vision problems.  You have a persistent headache.  You have upper abdominal pain or discomfort.  You develop an additional serious illness.  You have diarrhea for more than 6 hours.  You have been sick or have had a fever for a couple of days and are not getting better. SEEK IMMEDIATE MEDICAL CARE IF:   You have difficulty breathing.  You no longer feel the baby moving.  You are bleeding or have discharge from your vagina.  You start having premature contractions or labor. MAKE SURE YOU:  Understand these instructions.  Will watch your condition.  Will get help right away if you are not doing well or get worse.   This information is not intended to replace advice given to you by your health care provider. Make sure you discuss any questions you have with your health care provider.   Document Released: 07/22/2000 Document Revised: 05/06/2014 Document Reviewed: 11/12/2011 Elsevier Interactive Patient Education [employer]   Third Trimester of Pregnancy The third trimester is from week 29 through week 42, months 7 through 9. This trimester is when your unborn baby  (fetus) is growing very fast. At the end of the ninth month, the unborn baby is about 20 inches in length. It weighs about 6-10 pounds.  HOME CARE   Avoid all smoking, herbs, and alcohol. Avoid drugs not approved by your doctor.  Do not use any tobacco products, including cigarettes, chewing tobacco, and electronic cigarettes. If you need help quitting, ask your doctor. You may get counseling or other support to help you quit.  Only take medicine as told by your doctor. Some medicines are safe and some are not during pregnancy.  Exercise only as told by your doctor. Stop  exercising if you start having cramps.  Eat regular, healthy meals.  Wear a good support bra if your breasts are tender.  Do not use hot tubs, steam rooms, or saunas.  Wear your seat belt when driving.  Avoid raw meat, uncooked cheese, and liter boxes and soil used by cats.  Take your prenatal vitamins.  Take 1500-2000 milligrams of calcium daily starting at the 20th week of pregnancy until you deliver your baby.  Try taking medicine that helps you poop (stool softener) as needed, and if your doctor approves. Eat more fiber by eating fresh fruit, vegetables, and whole grains. Drink enough fluids to keep your pee (urine) clear or pale yellow.  Take warm water baths (sitz baths) to soothe pain or discomfort caused by hemorrhoids. Use hemorrhoid cream if your doctor approves.  If you have puffy, bulging veins (varicose veins), wear support hose. Raise (elevate) your feet for 15 minutes, 3-4 times a day. Limit salt in your diet.  Avoid heavy lifting, wear low heels, and sit up straight.  Rest with your legs raised if you have leg cramps or low back pain.  Visit your dentist if you have not gone during your pregnancy. Use a soft toothbrush to brush your teeth. Be gentle when you floss.  You can have sex (intercourse) unless your doctor tells you not to.  Do not travel far distances unless you must. Only do so with  your doctor's approval.  Take prenatal classes.  Practice driving to the hospital.  Pack your hospital bag.  Prepare the baby's room.  Go to your doctor visits. GET HELP IF:  You are not sure if you are in labor or if your water has broken.  You are dizzy.  You have mild cramps or pressure in your lower belly (abdominal).  You have a nagging pain in your belly area.  You continue to feel sick to your stomach (nauseous), throw up (vomit), or have watery poop (diarrhea).  You have bad smelling fluid coming from your vagina.  You have pain with peeing (urination). GET HELP RIGHT AWAY IF:   You have a fever.  You are leaking fluid from your vagina.  You are spotting or bleeding from your vagina.  You have severe belly cramping or pain.  You lose or gain weight rapidly.  You have trouble catching your breath and have chest pain.  You notice sudden or extreme puffiness (swelling) of your face, hands, ankles, feet, or legs.  You have not felt the baby move in over an hour.  You have severe headaches that do not go away with medicine.  You have vision changes.   This information is not intended to replace advice given to you by your health care provider. Make sure you discuss any questions you have with your health care provider.   Document Released: 07/10/2009 Document Revised: 05/06/2014 Document Reviewed: 06/16/2012 Elsevier Interactive Patient Education 2016 Elsevier Inc[employer]    Document Released: 01/23/2008 Document Revised: 08/30/2014 Document Reviewed: 04/11/2014 Elsevier Interactive Patient Education [employer]

## 2015-12-11 NOTE — Progress Notes (Signed)
Subjective:  Madison Bowman is a 30 y.o. G4P0030 at 3870w5d being seen today for ongoing prenatal care.  She is currently monitored for the following issues for this high-risk pregnancy and has Supervision of high-risk pregnancy; Tobacco smoking complicating pregnancy in third trimester; Gestational diabetes mellitus, antepartum; and IUGR (intrauterine growth restriction) on her problem list.  Patient reports no complaints.  Contractions: Not present.  .  Movement: Present. Denies leaking of fluid.   The following portions of the patient's history were reviewed and updated as appropriate: allergies, current medications, past family history, past medical history, past social history, past surgical history and problem list. Problem list updated.  Objective:   Vitals:   12/11/15 0940  BP: 116/82  Pulse: 85  Weight: 162 lb 1.6 oz (73.5 kg)    Fetal Status: Fetal Heart Rate (bpm): 145   Movement: Present     General:  Alert, oriented and cooperative. Patient is in no acute distress.  Skin: Skin is warm and dry. No rash noted.   Cardiovascular: Normal heart rate noted  Respiratory: Normal respiratory effort, no problems with respiration noted  Abdomen: Soft, gravid, appropriate for gestational age. Pain/Pressure: Present     Pelvic:  Cervical exam deferred        Extremities: Normal range of motion.     Mental Status: Normal mood and affect. Normal behavior. Normal judgment and thought content.   Urinalysis:      Assessment and Plan:  Pregnancy: G4P0030 at 7370w5d  1. Supervision of high-risk pregnancy, third trimester - Delivery by 39 weeks discussed with the patient  2. IUGR (intrauterine growth restriction) - Normal dopplers, high-normal AFI - Delivery by 39 weeks  3. Tobacco smoking complicating pregnancy in third trimester - Encouraged cessation  4. Gestational diabetes mellitus, antepartum - BS: [place] WNL;  2-hr PP 90-110s, occasional (rare) >120s - Diet controlled   There  are no diagnoses linked to this encounter. Term labor symptoms and general obstetric precautions including but not limited to vaginal bleeding, contractions, leaking of fluid and fetal movement were reviewed in detail with the patient. Please refer to After Visit Summary for other counseling recommendations.  Return in about 1 week (around 12/18/2015) for Routine OB visit.   [facility]illacElizabeth [place], [place]

## 2015-12-12 ENCOUNTER — Encounter: Payer: Self-pay | Admitting: General Practice

## 2015-12-12 DIAGNOSIS — H52203 Unspecified astigmatism, bilateral: Secondary | ICD-10-CM | POA: Diagnosis not present

## 2015-12-12 DIAGNOSIS — H5213 Myopia, bilateral: Secondary | ICD-10-CM | POA: Diagnosis not present

## 2015-12-13 ENCOUNTER — Ambulatory Visit (HOSPITAL_COMMUNITY)
Admission: RE | Admit: 2015-12-13 | Discharge: 2015-12-13 | Disposition: A | Discharge status: Home / Self Care | Payer: BLUE CROSS/BLUE SHIELD | Source: Ambulatory Visit | Attending: Obstetrics & Gynecology | Admitting: Obstetrics & Gynecology

## 2015-12-13 ENCOUNTER — Encounter (HOSPITAL_COMMUNITY): Payer: Self-pay

## 2015-12-13 DIAGNOSIS — O24410 Gestational diabetes mellitus in pregnancy, diet controlled: Secondary | ICD-10-CM | POA: Insufficient documentation

## 2015-12-13 DIAGNOSIS — IMO0002 Reserved for concepts with insufficient information to code with codable children: Secondary | ICD-10-CM

## 2015-12-13 DIAGNOSIS — Z3A38 38 weeks gestation of pregnancy: Secondary | ICD-10-CM | POA: Diagnosis not present

## 2015-12-13 DIAGNOSIS — O365930 Maternal care for other known or suspected poor fetal growth, third trimester, not applicable or unspecified: Secondary | ICD-10-CM | POA: Insufficient documentation

## 2015-12-14 ENCOUNTER — Telehealth (HOSPITAL_COMMUNITY): Payer: Self-pay | Admitting: *Deleted

## 2015-12-14 NOTE — Telephone Encounter (Signed)
Preadmission screen  

## 2015-12-18 ENCOUNTER — Inpatient Hospital Stay (HOSPITAL_COMMUNITY): Payer: BLUE CROSS/BLUE SHIELD | Admitting: Anesthesiology

## 2015-12-18 ENCOUNTER — Ambulatory Visit (HOSPITAL_COMMUNITY): Payer: BLUE CROSS/BLUE SHIELD

## 2015-12-18 ENCOUNTER — Encounter (HOSPITAL_COMMUNITY): Payer: Self-pay

## 2015-12-18 ENCOUNTER — Inpatient Hospital Stay (HOSPITAL_COMMUNITY)
Admission: AD | Admit: 2015-12-18 | Discharge: 2015-12-21 | DRG: 766 | Disposition: A | Discharge status: Home / Self Care | Payer: BLUE CROSS/BLUE SHIELD | Source: Ambulatory Visit | Attending: Family Medicine | Admitting: Family Medicine

## 2015-12-18 ENCOUNTER — Inpatient Hospital Stay (HOSPITAL_COMMUNITY)
Admission: AD | Admit: 2015-12-18 | Discharge: 2015-12-18 | Discharge status: Against Medical Advice | Payer: BLUE CROSS/BLUE SHIELD | Source: Ambulatory Visit | Attending: Obstetrics and Gynecology | Admitting: Obstetrics and Gynecology

## 2015-12-18 ENCOUNTER — Encounter (HOSPITAL_COMMUNITY): Payer: Self-pay | Admitting: *Deleted

## 2015-12-18 ENCOUNTER — Encounter (HOSPITAL_COMMUNITY)
Admission: AD | Disposition: A | Discharge status: Home / Self Care | Payer: Self-pay | Source: Ambulatory Visit | Attending: Family Medicine

## 2015-12-18 DIAGNOSIS — Z818 Family history of other mental and behavioral disorders: Secondary | ICD-10-CM | POA: Diagnosis not present

## 2015-12-18 DIAGNOSIS — Z8249 Family history of ischemic heart disease and other diseases of the circulatory system: Secondary | ICD-10-CM

## 2015-12-18 DIAGNOSIS — O99334 Smoking (tobacco) complicating childbirth: Secondary | ICD-10-CM | POA: Diagnosis not present

## 2015-12-18 DIAGNOSIS — O24419 Gestational diabetes mellitus in pregnancy, unspecified control: Secondary | ICD-10-CM

## 2015-12-18 DIAGNOSIS — O24429 Gestational diabetes mellitus in childbirth, unspecified control: Secondary | ICD-10-CM | POA: Diagnosis not present

## 2015-12-18 DIAGNOSIS — O365930 Maternal care for other known or suspected poor fetal growth, third trimester, not applicable or unspecified: Secondary | ICD-10-CM | POA: Diagnosis not present

## 2015-12-18 DIAGNOSIS — O0993 Supervision of high risk pregnancy, unspecified, third trimester: Secondary | ICD-10-CM

## 2015-12-18 DIAGNOSIS — Z3483 Encounter for supervision of other normal pregnancy, third trimester: Secondary | ICD-10-CM | POA: Diagnosis not present

## 2015-12-18 DIAGNOSIS — IMO0001 Reserved for inherently not codable concepts without codable children: Secondary | ICD-10-CM

## 2015-12-18 DIAGNOSIS — Z5321 Procedure and treatment not carried out due to patient leaving prior to being seen by health care provider: Secondary | ICD-10-CM | POA: Insufficient documentation

## 2015-12-18 DIAGNOSIS — Z3A38 38 weeks gestation of pregnancy: Secondary | ICD-10-CM | POA: Diagnosis not present

## 2015-12-18 DIAGNOSIS — Z23 Encounter for immunization: Secondary | ICD-10-CM | POA: Diagnosis not present

## 2015-12-18 DIAGNOSIS — F17210 Nicotine dependence, cigarettes, uncomplicated: Secondary | ICD-10-CM | POA: Diagnosis present

## 2015-12-18 DIAGNOSIS — O24420 Gestational diabetes mellitus in childbirth, diet controlled: Secondary | ICD-10-CM | POA: Diagnosis not present

## 2015-12-18 DIAGNOSIS — O288 Other abnormal findings on antenatal screening of mother: Secondary | ICD-10-CM

## 2015-12-18 LAB — CBC
HCT: 34.4 % — ABNORMAL LOW (ref 36.0–46.0)
Hemoglobin: 12 g/dL (ref 12.0–15.0)
MCH: 27.3 pg (ref 26.0–34.0)
MCHC: 34.9 g/dL (ref 30.0–36.0)
MCV: 78.4 fL (ref 78.0–100.0)
PLATELETS: 311 10*3/uL (ref 150–400)
RBC: 4.39 MIL/uL (ref 3.87–5.11)
RDW: 14 % (ref 11.5–15.5)
WBC: 27.4 10*3/uL — ABNORMAL HIGH (ref 4.0–10.5)

## 2015-12-18 LAB — GLUCOSE, CAPILLARY
GLUCOSE-CAPILLARY: 120 mg/dL — AB (ref 65–99)
GLUCOSE-CAPILLARY: 114 mg/dL — AB (ref 65–99)
Glucose-Capillary: 103 mg/dL — ABNORMAL HIGH (ref 65–99)

## 2015-12-18 LAB — TYPE AND SCREEN
ABO/RH(D): A POS
Antibody Screen: NEGATIVE

## 2015-12-18 SURGERY — Surgical Case
Anesthesia: Epidural

## 2015-12-18 MED ORDER — DEXAMETHASONE SODIUM PHOSPHATE 4 MG/ML IJ SOLN
INTRAMUSCULAR | Status: AC
  Filled 2015-12-18: qty 1

## 2015-12-18 MED ORDER — PHENYLEPHRINE HCL 10 MG/ML IJ SOLN
INTRAMUSCULAR | Status: DC | PRN
  Administered 2015-12-18 (×2): 80 ug via INTRAVENOUS
  Administered 2015-12-18 (×2): 40 ug via INTRAVENOUS
  Administered 2015-12-18 (×2): 80 ug via INTRAVENOUS

## 2015-12-18 MED ORDER — SIMETHICONE 80 MG PO CHEW
80.0000 mg | CHEWABLE_TABLET | Freq: Three times a day (TID) | ORAL | Status: DC
  Administered 2015-12-19 – 2015-12-21 (×7): 80 mg via ORAL
  Filled 2015-12-18 (×7): qty 1

## 2015-12-18 MED ORDER — OXYTOCIN 40 UNITS IN LACTATED RINGERS INFUSION - SIMPLE MED: 2.5000 [IU]/h | INTRAVENOUS | Status: AC

## 2015-12-18 MED ORDER — ONDANSETRON HCL 4 MG/2ML IJ SOLN
4.0000 mg | Freq: Four times a day (QID) | INTRAMUSCULAR | Status: DC | PRN
  Administered 2015-12-18: 4 mg via INTRAVENOUS

## 2015-12-18 MED ORDER — PROMETHAZINE HCL 25 MG/ML IJ SOLN: 6.2500 mg | INTRAMUSCULAR | Status: DC | PRN

## 2015-12-18 MED ORDER — NALOXONE HCL 0.4 MG/ML IJ SOLN: 0.4000 mg | INTRAMUSCULAR | Status: DC | PRN

## 2015-12-18 MED ORDER — LACTATED RINGERS IV SOLN
500.0000 mL | INTRAVENOUS | Status: DC | PRN
  Administered 2015-12-18 (×2): 1000 mL via INTRAVENOUS

## 2015-12-18 MED ORDER — ONDANSETRON HCL 4 MG/2ML IJ SOLN
INTRAMUSCULAR | Status: AC
  Filled 2015-12-18: qty 2

## 2015-12-18 MED ORDER — FLEET ENEMA 7-19 GM/118ML RE ENEM: 1.0000 | ENEMA | RECTAL | Status: DC | PRN

## 2015-12-18 MED ORDER — SENNOSIDES-DOCUSATE SODIUM 8.6-50 MG PO TABS
2.0000 | ORAL_TABLET | ORAL | Status: DC
  Administered 2015-12-19 – 2015-12-20 (×2): 2 via ORAL
  Filled 2015-12-18 (×2): qty 2

## 2015-12-18 MED ORDER — LACTATED RINGERS IV SOLN
INTRAVENOUS | Status: DC | PRN
  Administered 2015-12-18 (×2): via INTRAVENOUS

## 2015-12-18 MED ORDER — SIMETHICONE 80 MG PO CHEW: 80.0000 mg | CHEWABLE_TABLET | ORAL | Status: DC | PRN

## 2015-12-18 MED ORDER — DIPHENHYDRAMINE HCL 25 MG PO CAPS: 25.0000 mg | ORAL_CAPSULE | Freq: Four times a day (QID) | ORAL | Status: DC | PRN

## 2015-12-18 MED ORDER — OXYCODONE-ACETAMINOPHEN 5-325 MG PO TABS: 2.0000 | ORAL_TABLET | ORAL | Status: DC | PRN

## 2015-12-18 MED ORDER — LIDOCAINE HCL (PF) 1 % IJ SOLN
INTRAMUSCULAR | Status: DC | PRN
  Administered 2015-12-18: 4 mL
  Administered 2015-12-18: 6 mL via EPIDURAL

## 2015-12-18 MED ORDER — NALOXONE HCL 2 MG/2ML IJ SOSY
1.0000 ug/kg/h | PREFILLED_SYRINGE | INTRAVENOUS | Status: DC | PRN
  Filled 2015-12-18: qty 2

## 2015-12-18 MED ORDER — NALBUPHINE HCL 10 MG/ML IJ SOLN: 5.0000 mg | INTRAMUSCULAR | Status: DC | PRN

## 2015-12-18 MED ORDER — SIMETHICONE 80 MG PO CHEW
80.0000 mg | CHEWABLE_TABLET | ORAL | Status: DC
  Administered 2015-12-19 – 2015-12-20 (×2): 80 mg via ORAL
  Filled 2015-12-18 (×2): qty 1

## 2015-12-18 MED ORDER — LACTATED RINGERS IV SOLN
INTRAVENOUS | Status: DC
  Administered 2015-12-19: 04:00:00 via INTRAVENOUS

## 2015-12-18 MED ORDER — DIPHENHYDRAMINE HCL 25 MG PO CAPS: 25.0000 mg | ORAL_CAPSULE | ORAL | Status: DC | PRN

## 2015-12-18 MED ORDER — FENTANYL 2.5 MCG/ML BUPIVACAINE 1/10 % EPIDURAL INFUSION (WH - ANES)
14.0000 mL/h | INTRAMUSCULAR | Status: DC | PRN
  Administered 2015-12-18: 14 mL/h via EPIDURAL
  Filled 2015-12-18: qty 125

## 2015-12-18 MED ORDER — KETOROLAC TROMETHAMINE 30 MG/ML IJ SOLN: 30.0000 mg | Freq: Four times a day (QID) | INTRAMUSCULAR | Status: DC | PRN

## 2015-12-18 MED ORDER — EPHEDRINE 5 MG/ML INJ: 10.0000 mg | INTRAVENOUS | Status: DC | PRN

## 2015-12-18 MED ORDER — MENTHOL 3 MG MT LOZG: 1.0000 | LOZENGE | OROMUCOSAL | Status: DC | PRN

## 2015-12-18 MED ORDER — MEPERIDINE HCL 25 MG/ML IJ SOLN: 6.2500 mg | INTRAMUSCULAR | Status: DC | PRN

## 2015-12-18 MED ORDER — OXYTOCIN 40 UNITS IN LACTATED RINGERS INFUSION - SIMPLE MED: 2.5000 [IU]/h | INTRAVENOUS | Status: DC

## 2015-12-18 MED ORDER — COCONUT OIL OIL: 1.0000 "application " | TOPICAL_OIL | Status: DC | PRN

## 2015-12-18 MED ORDER — SODIUM CHLORIDE 0.9% FLUSH: 3.0000 mL | INTRAVENOUS | Status: DC | PRN

## 2015-12-18 MED ORDER — DIBUCAINE 1 % RE OINT: 1.0000 "application " | TOPICAL_OINTMENT | RECTAL | Status: DC | PRN

## 2015-12-18 MED ORDER — WITCH HAZEL-GLYCERIN EX PADS: 1.0000 "application " | MEDICATED_PAD | CUTANEOUS | Status: DC | PRN

## 2015-12-18 MED ORDER — CEFAZOLIN SODIUM-DEXTROSE 2-3 GM-% IV SOLR
INTRAVENOUS | Status: DC | PRN
  Administered 2015-12-18: 2 g via INTRAVENOUS

## 2015-12-18 MED ORDER — OXYTOCIN 10 UNIT/ML IJ SOLN
INTRAMUSCULAR | Status: AC
  Filled 2015-12-18: qty 4

## 2015-12-18 MED ORDER — MORPHINE SULFATE (PF) 0.5 MG/ML IJ SOLN
INTRAMUSCULAR | Status: DC | PRN
  Administered 2015-12-18: 4 mg via EPIDURAL

## 2015-12-18 MED ORDER — NALBUPHINE HCL 10 MG/ML IJ SOLN: 5.0000 mg | Freq: Once | INTRAMUSCULAR | Status: DC | PRN

## 2015-12-18 MED ORDER — LACTATED RINGERS IV SOLN
INTRAVENOUS | Status: DC
  Administered 2015-12-18: 18:00:00 via INTRAUTERINE

## 2015-12-18 MED ORDER — LACTATED RINGERS IV SOLN: 500.0000 mL | Freq: Once | INTRAVENOUS | Status: DC

## 2015-12-18 MED ORDER — OXYTOCIN BOLUS FROM INFUSION: 500.0000 mL | Freq: Once | INTRAVENOUS | Status: DC

## 2015-12-18 MED ORDER — LIDOCAINE HCL (PF) 1 % IJ SOLN: 30.0000 mL | INTRAMUSCULAR | Status: DC | PRN

## 2015-12-18 MED ORDER — DIPHENHYDRAMINE HCL 50 MG/ML IJ SOLN: 12.5000 mg | INTRAMUSCULAR | Status: DC | PRN

## 2015-12-18 MED ORDER — TETANUS-DIPHTH-ACELL PERTUSSIS 5-2.5-18.5 LF-MCG/0.5 IM SUSP: 0.5000 mL | Freq: Once | INTRAMUSCULAR | Status: DC

## 2015-12-18 MED ORDER — CEFAZOLIN SODIUM-DEXTROSE 2-4 GM/100ML-% IV SOLN
INTRAVENOUS | Status: AC
  Filled 2015-12-18: qty 100

## 2015-12-18 MED ORDER — ACETAMINOPHEN 500 MG PO TABS
1000.0000 mg | ORAL_TABLET | Freq: Four times a day (QID) | ORAL | Status: AC
  Administered 2015-12-19 (×3): 1000 mg via ORAL
  Filled 2015-12-18 (×4): qty 2

## 2015-12-18 MED ORDER — SCOPOLAMINE 1 MG/3DAYS TD PT72
1.0000 | MEDICATED_PATCH | Freq: Once | TRANSDERMAL | Status: DC
  Filled 2015-12-18: qty 1

## 2015-12-18 MED ORDER — ONDANSETRON HCL 4 MG/2ML IJ SOLN: 4.0000 mg | Freq: Three times a day (TID) | INTRAMUSCULAR | Status: DC | PRN

## 2015-12-18 MED ORDER — PRENATAL MULTIVITAMIN CH
1.0000 | ORAL_TABLET | Freq: Every day | ORAL | Status: DC
  Filled 2015-12-18 (×2): qty 1

## 2015-12-18 MED ORDER — ZOLPIDEM TARTRATE 5 MG PO TABS: 5.0000 mg | ORAL_TABLET | Freq: Every evening | ORAL | Status: DC | PRN

## 2015-12-18 MED ORDER — OXYCODONE-ACETAMINOPHEN 5-325 MG PO TABS: 1.0000 | ORAL_TABLET | ORAL | Status: DC | PRN

## 2015-12-18 MED ORDER — FENTANYL CITRATE (PF) 100 MCG/2ML IJ SOLN: 25.0000 ug | INTRAMUSCULAR | Status: DC | PRN

## 2015-12-18 MED ORDER — BUPIVACAINE 0.5 % ON-Q PUMP SINGLE CATH 300 ML
300.0000 mL | INJECTION | Status: DC
  Filled 2015-12-18: qty 300

## 2015-12-18 MED ORDER — PHENYLEPHRINE 40 MCG/ML (10ML) SYRINGE FOR IV PUSH (FOR BLOOD PRESSURE SUPPORT)
80.0000 ug | PREFILLED_SYRINGE | INTRAVENOUS | Status: DC | PRN
  Filled 2015-12-18: qty 10

## 2015-12-18 MED ORDER — PHENYLEPHRINE 40 MCG/ML (10ML) SYRINGE FOR IV PUSH (FOR BLOOD PRESSURE SUPPORT): 80.0000 ug | PREFILLED_SYRINGE | INTRAVENOUS | Status: DC | PRN

## 2015-12-18 MED ORDER — ACETAMINOPHEN 325 MG PO TABS: 650.0000 mg | ORAL_TABLET | ORAL | Status: DC | PRN

## 2015-12-18 MED ORDER — BUPIVACAINE HCL (PF) 0.5 % IJ SOLN
INTRAMUSCULAR | Status: DC | PRN
  Administered 2015-12-18: 30 mL

## 2015-12-18 MED ORDER — SCOPOLAMINE 1 MG/3DAYS TD PT72
MEDICATED_PATCH | TRANSDERMAL | Status: DC | PRN
  Administered 2015-12-18: 1 via TRANSDERMAL

## 2015-12-18 MED ORDER — OXYTOCIN 10 UNIT/ML IJ SOLN
INTRAVENOUS | Status: DC | PRN
  Administered 2015-12-18: 40 [IU] via INTRAVENOUS

## 2015-12-18 MED ORDER — MORPHINE SULFATE (PF) 0.5 MG/ML IJ SOLN
INTRAMUSCULAR | Status: AC
  Filled 2015-12-18: qty 10

## 2015-12-18 MED ORDER — ACETAMINOPHEN 325 MG PO TABS
650.0000 mg | ORAL_TABLET | Freq: Once | ORAL | Status: AC
  Administered 2015-12-18: 650 mg via ORAL
  Filled 2015-12-18: qty 2

## 2015-12-18 MED ORDER — BUPIVACAINE HCL (PF) 0.5 % IJ SOLN
INTRAMUSCULAR | Status: AC
  Filled 2015-12-18: qty 30

## 2015-12-18 MED ORDER — IBUPROFEN 600 MG PO TABS
600.0000 mg | ORAL_TABLET | Freq: Four times a day (QID) | ORAL | Status: DC
  Administered 2015-12-19 – 2015-12-21 (×9): 600 mg via ORAL
  Filled 2015-12-18 (×10): qty 1

## 2015-12-18 MED ORDER — DEXAMETHASONE SODIUM PHOSPHATE 4 MG/ML IJ SOLN
INTRAMUSCULAR | Status: DC | PRN
  Administered 2015-12-18: 4 mg via INTRAVENOUS

## 2015-12-18 MED ORDER — LACTATED RINGERS IV SOLN
INTRAVENOUS | Status: DC
  Administered 2015-12-18 (×2): via INTRAVENOUS

## 2015-12-18 MED ORDER — EPHEDRINE 5 MG/ML INJ
10.0000 mg | INTRAVENOUS | Status: DC | PRN
Start: 2015-12-18 — End: 2015-12-18

## 2015-12-18 MED ORDER — SOD CITRATE-CITRIC ACID 500-334 MG/5ML PO SOLN
30.0000 mL | ORAL | Status: DC | PRN
  Administered 2015-12-18 (×2): 30 mL via ORAL
  Filled 2015-12-18 (×2): qty 15

## 2015-12-18 MED ORDER — SCOPOLAMINE 1 MG/3DAYS TD PT72
MEDICATED_PATCH | TRANSDERMAL | Status: AC
  Filled 2015-12-18: qty 1

## 2015-12-18 SURGICAL SUPPLY — 37 items
ADH SKN CLS LQ APL DERMABOND (GAUZE/BANDAGES/DRESSINGS) ×1
APL SKNCLS STERI-STRIP NONHPOA (GAUZE/BANDAGES/DRESSINGS) ×1
BENZOIN TINCTURE PRP APPL 2/3 (GAUZE/BANDAGES/DRESSINGS) ×2 IMPLANT
CATH KIT ON Q 5IN SLV (PAIN MANAGEMENT) ×1 IMPLANT
CHLORAPREP W/TINT 26ML (MISCELLANEOUS) ×2 IMPLANT
CLAMP CORD UMBIL (MISCELLANEOUS) IMPLANT
CLOTH BEACON ORANGE TIMEOUT ST (SAFETY) ×2 IMPLANT
DECANTER SPIKE VIAL GLASS SM (MISCELLANEOUS) ×1 IMPLANT
DERMABOND ADHESIVE PROPEN (GAUZE/BANDAGES/DRESSINGS) ×1
DERMABOND ADVANCED .7 DNX6 (GAUZE/BANDAGES/DRESSINGS) IMPLANT
DRSG OPSITE POSTOP 4X10 (GAUZE/BANDAGES/DRESSINGS) ×2 IMPLANT
ELECT REM PT RETURN 9FT ADLT (ELECTROSURGICAL) ×2
ELECTRODE REM PT RTRN 9FT ADLT (ELECTROSURGICAL) ×1 IMPLANT
EXTRACTOR VACUUM M CUP 4 TUBE (SUCTIONS) IMPLANT
GLOVE BIOGEL PI IND STRL 7.0 (GLOVE) ×2 IMPLANT
GLOVE BIOGEL PI IND STRL 7.5 (GLOVE) ×2 IMPLANT
GLOVE BIOGEL PI INDICATOR 7.0 (GLOVE) ×2
GLOVE BIOGEL PI INDICATOR 7.5 (GLOVE) ×2
GLOVE ECLIPSE 7.5 STRL STRAW (GLOVE) ×2 IMPLANT
GOWN STRL REUS W/TWL LRG LVL3 (GOWN DISPOSABLE) ×6 IMPLANT
KIT ABG SYR 3ML LUER SLIP (SYRINGE) ×1 IMPLANT
LIQUID BAND (GAUZE/BANDAGES/DRESSINGS) ×1 IMPLANT
NDL HYPO 25X5/8 SAFETYGLIDE (NEEDLE) IMPLANT
NEEDLE HYPO 25X5/8 SAFETYGLIDE (NEEDLE) ×2 IMPLANT
NS IRRIG 1000ML POUR BTL (IV SOLUTION) ×2 IMPLANT
PACK C SECTION WH (CUSTOM PROCEDURE TRAY) ×2 IMPLANT
PAD OB MATERNITY 4.3X12.25 (PERSONAL CARE ITEMS) ×2 IMPLANT
PENCIL SMOKE EVAC W/HOLSTER (ELECTROSURGICAL) ×2 IMPLANT
RTRCTR C-SECT PINK 25CM LRG (MISCELLANEOUS) ×2 IMPLANT
STRIP CLOSURE SKIN 1/2X4 (GAUZE/BANDAGES/DRESSINGS) ×2 IMPLANT
SUT VIC AB 0 CTX 36 (SUTURE) ×8
SUT VIC AB 0 CTX36XBRD ANBCTRL (SUTURE) ×3 IMPLANT
SUT VIC AB 2-0 CT1 27 (SUTURE) ×2
SUT VIC AB 2-0 CT1 TAPERPNT 27 (SUTURE) ×1 IMPLANT
SUT VIC AB 4-0 KS 27 (SUTURE) ×2 IMPLANT
TOWEL OR 17X24 6PK STRL BLUE (TOWEL DISPOSABLE) ×2 IMPLANT
TRAY FOLEY CATH SILVER 14FR (SET/KITS/TRAYS/PACK) ×1 IMPLANT

## 2015-12-18 NOTE — Progress Notes (Signed)
Madison Bowman is a 30 y.o. G4P0030 at 2321w5d   Subjective: Having some variables/  Comfortable with epidural.  Objective: BP 131/73   Pulse 81   Temp 98.5 F (36.9 C) (Oral)   Resp 16   Ht 5' 1.5" (1.562 m)   Wt 163 lb (73.9 kg)   LMP  (LMP Unknown)   SpO2 100%   BMI 30.30 kg/m  No intake/output data recorded. No intake/output data recorded.  FHT:  FHR: 140s bpm, variability: moderate,  accelerations:  Abscent,  decelerations:  Present prolonged deceleration after AROM with IUPC placement. UC:   regular, every 3 minutes SVE:   Dilation: 4.5 Effacement (%): 80 Station: -1 Exam by:: [employer]  Labs: Lab Results  Component Value Date   WBC 27.4 (H) 12/18/2015   HGB 12.0 12/18/2015   HCT 34.4 (L) 12/18/2015   MCV 78.4 12/18/2015   PLT 311 12/18/2015    Assessment / Plan: 7-8 minute prolonged deceleration Amnioinfusion started.  AROm with meconium.   Madison Bowman 12/18/2015, 6:07 PM

## 2015-12-18 NOTE — H&P (Signed)
LABOR AND DELIVERY ADMISSION HISTORY AND PHYSICAL NOTE  Madison Bowman is a 30 y.o. female 744P0030 with IUP at 3140w5d by 10wk U/S presenting for SOL. Presented to MAU earlier today after 1 hour of q445min ctx, refused BPP and left AMA. Presented back at MAU with ctx q272min and found to be in active labor.  She reports positive fetal movement. She denies leakage of fluid or vaginal bleeding.  Prenatal History/Complications: GDMA1, IUGR  Past Medical History: Past Medical History:  Diagnosis Date  . Anxiety   . Gestational diabetes     Past Surgical History: Past Surgical History:  Procedure Laterality Date  . NO PAST SURGERIES      Obstetrical History: OB History    Gravida Para Term Preterm AB Living   4 0 0 0 3 0   SAB TAB Ectopic Multiple Live Births   2 1 0 0        Obstetric Comments   Patient had TAB with Cytotec at age 30 years and her family/husband is unaware of this history       Social History: Social History   Social History  . Marital status: Single    Spouse name: N/A  . Number of children: N/A  . Years of education: N/A   Social History Main Topics  . Smoking status: Current Every Day Smoker    Packs/day: 0.50    Types: Cigarettes  . Smokeless tobacco: Never Used  . Alcohol use No  . Drug use: No  . Sexual activity: Yes    Birth control/ protection: None   Other Topics Concern  . None   Social History Narrative  . None    Family History: Family History  Problem Relation Age of Onset  . Thyroid disease Mother   . Heart disease Paternal Grandfather   . Depression Maternal Grandfather     Allergies: Allergies  Allergen Reactions  . Penicillins Rash    Has patient had a PCN reaction causing immediate rash, facial/tongue/throat swelling, SOB or lightheadedness with hypotension: Yes Has patient had a PCN reaction causing severe rash involving mucus membranes or skin necrosis: Yes Has patient had a PCN reaction that required hospitalization  No Has patient had a PCN reaction occurring within the last 10 years: No If all of the above answers are "NO", then may proceed with Cephalosporin use.     Prescriptions Prior to Admission  Medication Sig Dispense Refill Last Dose  . calcium carbonate (TUMS - DOSED IN MG ELEMENTAL CALCIUM) 500 MG chewable tablet Chew 1 tablet by mouth daily as needed for indigestion or heartburn.    12/18/2015 at Unknown time  . Hyprom-Naphaz-Polysorb-Zn Sulf (CLEAR EYES COMPLETE OP) Apply 1 drop to eye daily as needed (for eye dryness or irritation).   Past Week at Unknown time  . sertraline (ZOLOFT) 100 MG tablet Take 200 mg by mouth at bedtime.    12/17/2015 at Unknown time  . BAYER MICROLET LANCETS lancets Use as instructed 100 each 12 Taking  . glucose blood test strip Use as instructed 100 each 12 Not Taking     Review of Systems   All systems reviewed and negative except as stated in HPI  Blood pressure 119/66, pulse 97, temperature 98.5 F (36.9 C), temperature source Oral, resp. rate 16, height 5' 1.5" (1.562 m), weight 73.9 kg (163 lb), SpO2 100 %, unknown if currently breastfeeding. General appearance: alert, cooperative and no distress Lungs: no respiratory distress Heart: regular rate Abdomen: soft, non-tender  Extremities: No calf swelling or tenderness Presentation: cephalic by cervical exam Fetal monitoring: 140 baseline, moderate variability, no accelerations, occ early decelerations.  Uterine activity: q733min Dilation: 4.5 Effacement (%): 80 Station: -1 Exam by:: Hess CorporationStinson   Prenatal labs: ABO, Rh: --/--/A POS (08/21 1524) Antibody: NEG (08/21 1524) Rubella: immune RPR: NON REAC (06/13 1112)  HBsAg: NEGATIVE (03/15 1110)  HIV: NONREACTIVE (06/13 1112)  GBS: Negative (07/31 0000)  Genetic screening:  normal Anatomy US: normal  Prenatal Transfer Tool  Maternal Diabetes: Yes:  Diabetes Type:  Diet controlled Genetic Screening: Normal Maternal Ultrasounds/Referrals:  Normal Fetal Ultrasounds or other Referrals:  None Maternal Substance Abuse:  No Significant Maternal Medications:  Meds include: Zoloft Significant Maternal Lab Results: Lab values include: Group B Strep negative  Results for orders placed or performed during the hospital encounter of 12/18/15 (from the past 24 hour(s))  CBC   Collection Time: 12/18/15  3:24 PM  Result Value Ref Range   WBC 27.4 (H) 4.0 - 10.5 K/uL   RBC 4.39 3.87 - 5.11 MIL/uL   Hemoglobin 12.0 12.0 - 15.0 g/dL   HCT 16.134.4 (L) 09.636.0 - 04.546.0 %   MCV 78.4 78.0 - 100.0 fL   MCH 27.3 26.0 - 34.0 pg   MCHC 34.9 30.0 - 36.0 g/dL   RDW 40.914.0 81.111.5 - 91.415.5 %   Platelets 311 150 - 400 K/uL  Type and screen [facility]   Collection Time: 12/18/15  3:24 PM  Result Value Ref Range   ABO/RH(D) A POS    Antibody Screen NEG    Sample Expiration 12/21/2015   Glucose, capillary   Collection Time: 12/18/15  7:19 PM  Result Value Ref Range   Glucose-Capillary 120 (H) 65 - 99 mg/dL    Patient Active Problem List   Diagnosis Date Noted  . Active labor at term 12/18/2015  . IUGR (intrauterine growth restriction) 12/04/2015  . Gestational diabetes mellitus, antepartum 10/16/2015  . Tobacco smoking complicating pregnancy in third trimester 07/12/2015  . Supervision of high-risk pregnancy 06/01/2015    Assessment: Madison Bowman is a 30 y.o. G4P0030 at 3448w5d here for SOL   #Labor:s/p AROM to mec with IUPC placed undergoing aminoinfusion #Pain: Epidural placed #FWB: Category II  #ID:  GBS neg #MOF: breast #MOC:undecided #Circ:  Girl, N/A  Leland HerElsia J Yoo, DO PGY-1 8/21/20177:34 PM   OB FELLOW HISTORY AND PHYSICAL ATTESTATION  I have seen and examined this patient; I agree with above documentation in the resident's note.    Jen MowElizabeth Ranette Luckadoo, DO OB Fellow 12/19/2015, 9:04 AM

## 2015-12-18 NOTE — Progress Notes (Signed)
Patient ID: Madison AlertAna A Haggart, female   DOB: Aug 22, 1985, 30 y.o.   MRN: [id number]  Since AROM, patient has continued to have late and prolonged decelerations.  Unable to start pitocin.  Discussed options with patient - cervix unchanged.  Will proceed with cesarean section.  The risks of cesarean section discussed with the patient included but were not limited to: bleeding which may require transfusion or reoperation; infection which may require antibiotics; injury to bowel, bladder, ureters or other surrounding organs; injury to the fetus; need for additional procedures including hysterectomy in the event of a life-threatening hemorrhage; placental abnormalities wth subsequent pregnancies, incisional problems, thromboembolic phenomenon and other postoperative/anesthesia complications. The patient concurred with the proposed plan, giving informed written consent for the procedure.   Patient has been NPO since this morning she will remain NPO for procedure. Anesthesia and OR aware.  Preoperative prophylactic Ancef ordered on call to the OR.  To OR when ready.  Levie HeritageJacob J Margrit Minner, DO 12/18/2015 8:32 PM

## 2015-12-18 NOTE — MAU Note (Signed)
PT  SAYS HAVING UC -  AND  PASSED  MUCUS  PLUG

## 2015-12-18 NOTE — Anesthesia Pain Management Evaluation Note (Signed)
2 CRNA Pain Management Visit Note  Patient: Madison AlertAna A Haynesworth, 30 y.o., female  "Hello I am a member of the anesthesia team at [facility]. We have an anesthesia team available at all times to provide care throughout the hospital, including epidural management and anesthesia for C-section. I don't know your plan for the delivery whether it a natural birth, water birth, IV sedation, nitrous supplementation, doula or epidural, but we want to meet your pain goals."   1.Was your pain managed to your expectations on prior hospitalizations?   Yes   2.What is your expectation for pain management during this hospitalization?     Epidural  3.How can we help you reach that goal? epidural  Record the patient's initial score and the patient's pain goal.   Pain: 10  Pain Goal: 0/10 [facility] wants you to be able to say your pain was always managed very well.  Salome ArntSterling, Garrie Elenes Marie 12/18/2015

## 2015-12-18 NOTE — Op Note (Addendum)
Tanelle A Frericks PROCEDURE DATE: 12/18/2015  PREOPERATIVE DIAGNOSIS: Intrauterine pregnancy at  457w5d weeks gestation; non-reassuring fetal status  POSTOPERATIVE DIAGNOSIS: The same  PROCEDURE: Primary Low Transverse Cesarean Section  SURGEON:  Dr. Candelaria CelesteJacob Keith Cancio  ASSISTANT: Dr Jen MowElizabeth Mumaw  INDICATIONS: Madison Bowman is a 30 y.o. G4P0030 at 1077w5d scheduled for cesarean section secondary to non-reassuring fetal status.  The risks of cesarean section discussed with the patient included but were not limited to: bleeding which may require transfusion or reoperation; infection which may require antibiotics; injury to bowel, bladder, ureters or other surrounding organs; injury to the fetus; need for additional procedures including hysterectomy in the event of a life-threatening hemorrhage; placental abnormalities wth subsequent pregnancies, incisional problems, thromboembolic phenomenon and other postoperative/anesthesia complications. The patient concurred with the proposed plan, giving informed written consent for the procedure.    FINDINGS:  Viable female infant in cephalic presentation.  Apgars 4 and 8, weight pending.  Clear amniotic fluid.  Intact placenta, three vessel cord.  Normal uterus, fallopian tubes and ovaries bilaterally.  ANESTHESIA:    Spinal INTRAVENOUS FLUIDS: 1600 ml ESTIMATED BLOOD LOSS: 700 ml URINE OUTPUT:  200 ml SPECIMENS: Placenta sent to L&D COMPLICATIONS: None immediate  PROCEDURE IN DETAIL:  The patient received intravenous antibiotics and had sequential compression devices applied to her lower extremities while in the preoperative area.  She was then taken to the operating room where epidural anesthesia was dosed up to surgical level and was found to be adequate. She was then placed in a dorsal supine position with a leftward tilt, and prepped and draped in a sterile manner.  A foley catheter had been placed in the labor room prior to arrival in the operating room.  It  was attached to constant gravity, which drained clear fluid throughout.  After an adequate timeout was performed, a Pfannenstiel skin incision was made with scalpel and carried through to the underlying layer of fascia. The fascia was incised in the midline and this incision was extended bilaterally using the Mayo scissors. Kocher clamps were applied to the superior aspect of the fascial incision and the underlying rectus muscles were dissected off bluntly. A similar process was carried out on the inferior aspect of the facial incision. The rectus muscles were separated in the midline bluntly and the peritoneum was entered bluntly. An Alexis retractor was placed to aid in visualization of the uterus.  Attention was turned to the lower uterine segment where a transverse hysterotomy was made with a scalpel and extended bilaterally bluntly. The infant was successfully delivered, and cord was clamped and cut and infant was handed over to awaiting neonatology team. Uterine massage was then administered and the placenta delivered intact with three-vessel cord. The uterus was then cleared of clot and debris.  The hysterotomy was closed with 0 Vicryl in a running locked fashion, and an imbricating layer was also placed with a 0 Vicryl. Overall, excellent hemostasis was noted. The abdomen and the pelvis were cleared of all clot and debris and the Jon Gillslexis was removed. Hemostasis was confirmed on all surfaces.  The peritoneum was reapproximated using 2-0 vicryl running stitches. The catheter for an On-Q pump was primed and inserted into the skin and fascia approximately 5cm from the skin incision on the right corner.  This was laid across the rectus muscles. The fascia was then closed using 0 Vicryl in a running fashion.  The On-Q catheter was retracted 1 cm to ensure that it was not entrapped.  The skin was closed with 4-0 vicryl. The patient tolerated the procedure well. Sponge, lap, instrument and needle counts were correct  x 2. She was taken to the recovery room in stable condition.    [address]izabeth Woodland MasonMumaw, [place]DO  OB Fellow 12/18/2015 10:07 PM   Attestation of Attending Supervision of Obstetric Fellow During Surgery: Surgery was performed by the Obstetric Fellow under my supervision and collaboration.  I have reviewed the Obstetric Fellow's operative report, and I agree with the documentation.  I was present and scrubbed for the entire procedure.    Candelaria CelesteJacob Sabastien Tyler, DO Attending Physician Faculty Practice, [facility]

## 2015-12-18 NOTE — Consult Note (Signed)
[facility]  Delivery Note:  C-section       12/18/2015  9:05 PM  I was called to the operating room at the request of the patient's obstetrician (Dr. Stinson) for a primary c-section for fetal intolerance of labor.  PRENATAL HX:  This is a 29 y/o G4P0030 at 38 and 5/[redacted] weeks gestation who was admitted today for active labor.  Her pregnancy has been complicated by GDMA1 and IUGR.  She began to have late and prolonged decelerations so delivery was by c-section for fetal intolerance.  AROM at 1753 today with moderate meconium (ROM ~4 hours).    DELIVERY:  Infant had good tone but no cry and poor respiratory effort at delivery.  Despite standard warming, drying and stimulation, HR remained below 100.  PPV administered for 30 seconds with immediate improvement in HR. Respiratory effort improved and HR remained above 100.  However, O2 saturations in low 70s at 5 minutes so 60% blow by O2 was administered for 1 minute.  O2 saturations then remained in the low 90s.  APGARs 4 and 8.  Exam within normal limits though infant is SGA with BW 2020g.  After 10 minutes, baby left with nurse to assist parents with skin-to-skin care.   _____________________ Electronically Signed By: Dorrie Cocuzza, MD Neonatologist 

## 2015-12-18 NOTE — Progress Notes (Signed)
Went in to notify pt of time frame for ultrasound. Pt and S/O are not in room. Pt belongings are gone. Monitors were off and laying on bed, along with gown.

## 2015-12-18 NOTE — Anesthesia Preprocedure Evaluation (Addendum)
Anesthesia Evaluation  Patient identified by MRN, date of birth, ID band Patient awake    Reviewed: Allergy & Precautions, H&P , Patient's Chart, lab work & pertinent test results  Airway Mallampati: II  TM Distance: >3 FB Neck ROM: full    Dental  (+) Teeth Intact   Pulmonary Current Smoker,    breath sounds clear to auscultation       Cardiovascular  Rhythm:regular Rate:Normal     Neuro/Psych    GI/Hepatic   Endo/Other  diabetes, Gestational  Renal/GU      Musculoskeletal   Abdominal   Peds  Hematology   Anesthesia Other Findings       Reproductive/Obstetrics (+) Pregnancy                            Anesthesia Physical Anesthesia Plan  ASA: II  Anesthesia Plan: Epidural   Post-op Pain Management:    Induction:   Airway Management Planned:   Additional Equipment:   Intra-op Plan:   Post-operative Plan:   Informed Consent: I have reviewed the patients History and Physical, chart, labs and discussed the procedure including the risks, benefits and alternatives for the proposed anesthesia with the patient or authorized representative who has indicated his/her understanding and acceptance.   Dental Advisory Given  Plan Discussed with:   Anesthesia Plan Comments: (Labs checked- platelets confirmed with RN in room. Fetal heart tracing, per RN, reported to be stable enough for sitting procedure. Discussed epidural, and patient consents to the procedure:  included risk of possible headache,backache, failed block, allergic reaction, and nerve injury. This patient was asked if she had any questions or concerns before the procedure started.  2040: C/S called for fetal intolerance to labor. Discussed use of epidural for C/S . Questions answered. Patient is concerned with feeling "loopy" with narcotics. Discussed options for pain control and that epidural narcotics usually best option.  Patient voices understanding.)       Anesthesia Quick Evaluation

## 2015-12-18 NOTE — MAU Note (Signed)
SAYS UC STRONG AT [phone]

## 2015-12-18 NOTE — MAU Note (Signed)
Pt c/o contractions that are strong and regular since noon. Pt states the baby has been moving normally. Pt denies bleeding and leaking of fluid.

## 2015-12-18 NOTE — Transfer of Care (Signed)
Immediate Anesthesia Transfer of Care Note  Patient: Madison Bowman  Procedure(s) Performed: Procedure(s): CESAREAN SECTION (N/A)  Patient Location: PACU  Anesthesia Type:Epidural  Level of Consciousness: awake, alert  and oriented  Airway & Oxygen Therapy: Patient Spontanous Breathing  Post-op Assessment: Report given to RN and Post -op Vital signs reviewed and stable  Post vital signs: Reviewed and stable  Last Vitals:  Vitals:   12/18/15 1931 12/18/15 2031  BP: 119/66 (!) 116/55  Pulse: 97 88  Resp: 16   Temp: 37.2 C     Last Pain:  Vitals:   12/18/15 1931  TempSrc: Oral  PainSc:       Patients Stated Pain Goal: 4 (12/18/15 1438)  Complications: No apparent anesthesia complications

## 2015-12-19 ENCOUNTER — Encounter: Payer: Self-pay | Admitting: *Deleted

## 2015-12-19 LAB — CBC
HCT: 33.1 % — ABNORMAL LOW (ref 36.0–46.0)
Hemoglobin: 11.2 g/dL — ABNORMAL LOW (ref 12.0–15.0)
MCH: 26.6 pg (ref 26.0–34.0)
MCHC: 33.8 g/dL (ref 30.0–36.0)
MCV: 78.6 fL (ref 78.0–100.0)
Platelets: 309 10*3/uL (ref 150–400)
RBC: 4.21 MIL/uL (ref 3.87–5.11)
RDW: 14 % (ref 11.5–15.5)
WBC: 28.6 10*3/uL — ABNORMAL HIGH (ref 4.0–10.5)

## 2015-12-19 LAB — RPR: RPR: NONREACTIVE

## 2015-12-19 MED ORDER — SERTRALINE HCL 100 MG PO TABS
200.0000 mg | ORAL_TABLET | Freq: Every day | ORAL | Status: DC
  Administered 2015-12-20 (×2): 200 mg via ORAL
  Filled 2015-12-19 (×2): qty 2

## 2015-12-19 MED FILL — Bupivacaine HCl Preservative Free (PF) Inj 0.5%: INTRAMUSCULAR | Qty: 300 | Status: AC

## 2015-12-19 NOTE — Progress Notes (Signed)
POSTPARTUM PROGRESS NOTE  Post Operative Day 1 Subjective:  Madison Bowman is a 30 y.o. G4P0030 7068w6d s/p PLTCS for fetal indications.  No acute events overnight.  Pt denies problems with ambulating, voiding or po intake.  She denies nausea or vomiting.  Pain is well controlled with onQ pump.  She has had flatus. She has not had bowel movement.  Lochia Minimal.   Objective: Blood pressure 137/80, pulse 84, temperature 98.8 F (37.1 C), temperature source Oral, resp. rate 18, height 5' 1.5" (1.562 m), weight 163 lb (73.9 kg), SpO2 100 %, unknown if currently breastfeeding.  Physical Exam:  General: alert, cooperative and no distress Lochia:normal flow Chest: no respiratory distress Heart:regular rate, distal pulses intact Incision: C/D/I with on Q pump in place Abdomen: soft, nontender,  Uterine Fundus: firm, appropriately tender DVT Evaluation: No calf swelling or tenderness Extremities: trace edema   Recent Labs  12/18/15 1524 12/19/15 0542  HGB 12.0 11.2*  HCT 34.4* 33.1*    Assessment/Plan:  ASSESSMENT: Madison Bowman is a 30 y.o. G4P0030 2668w6d s/p PLTCS for fetal indications  Breastfeeding and Contraception undecided   LOS: 1 day   Les Pouicholas SchenkMD 12/19/2015, 7:53 AM

## 2015-12-19 NOTE — Progress Notes (Signed)
MOB was referred for history of depression/anxiety. * Referral screened out by Clinical Social Worker because none of the following criteria appear to apply: ~ History of anxiety/depression during this pregnancy, or of post-partum depression. ~ Diagnosis of anxiety and/or depression within last 3 years OR * MOB's symptoms currently being treated with medication and/or therapy. Please contact the Clinical Social Worker if needs arise, or if MOB requests.  Neeti Knudtson Boyd-Gilyard, MSW, LCSW Clinical Social Work [phone] 

## 2015-12-19 NOTE — Lactation Note (Signed)
This note was copied from a baby's chart. Lactation Consultation Note Baby is to be supplemented w/Alimentum after BF according to hours of age formula supplementation sheet every 3 hrs. If baby to sleepy to BF continue STS, hand express, give colostrum in spoon, then supplement w/formula in bottle w/slow flow nipple. Mom is in agreement w/LC plan. Patient Name: Madison Bowman Reason for consult: Initial assessment   Maternal Data Has patient been taught Hand Expression?: Yes Does the patient have breastfeeding experience prior to this delivery?: No  Feeding Feeding Type: Breast Fed Length of feed: 0 min  LATCH Score/Interventions Latch: Too sleepy or reluctant, no latch achieved, no sucking elicited. (baby sleepy and reluctant to latch)                    Lactation Tools Discussed/Used Tools: Pump;Shells Shell Type: Inverted Breast pump type: Double-Electric Breast Pump   Consult Status Consult Status: Follow-up Date: 12/19/15 Follow-up type: In-patient    Madison Bowman, Diamond NickelLAURA G Bowman, 2:32 AM

## 2015-12-19 NOTE — Anesthesia Postprocedure Evaluation (Signed)
Anesthesia Post Note  Patient: Josefina A Sylve  Procedure(s) Performed: Procedure(s) (LRB): CESAREAN SECTION (N/A)  Patient location during evaluation: Mother Baby Anesthesia Type: Epidural Level of consciousness: awake, awake and alert and oriented Pain management: pain level controlled Vital Signs Assessment: post-procedure vital signs reviewed and stable Respiratory status: spontaneous breathing, nonlabored ventilation and respiratory function stable Cardiovascular status: stable Postop Assessment: no backache, no signs of nausea or vomiting, adequate PO intake, no headache and patient able to bend at knees Anesthetic complications: no     Last Vitals:  Vitals:   12/19/15 0510 12/19/15 0610  BP: (!) 154/88 137/80  Pulse:    Resp:    Temp:      Last Pain:  Vitals:   12/19/15 0400  TempSrc:   PainSc: 0-No pain   Pain Goal: Patients Stated Pain Goal: 4 (12/18/15 1438)               Tamira Ryland

## 2015-12-19 NOTE — Lactation Note (Signed)
This note was copied from a baby's chart. Lactation Consultation Note New mom had c/s w/4.8 lb baby IUGR 38 5/7 weeks. Mom has put baby to breast, states she wasn't sure if she got anything or not. Hand expression taught w/colostrum noted. Rt. Everted short shaft Nipple has skin tags to the end of nipple. Liberty asked if they were blisters or irritation from baby suckling. Mom stated they have been there for a long time. Lt. Nipple inverted. Areolas compressible. Shells given to mom to wear in bra in am. Mom  Tired, baby 5 hrs old. DEBP kit and pump in rm. Encouraged mom to post pump for breast stimulation d/t small baby may not stimulate as vigorous as a larger baby. Encouraged to BF first then supplement w/Alimentum every 2-3 hrs.  Feeding chart left at bedside and reviewed. Education of IUGR newborn baby, STS, I&O, supply and demand importance. RN will set up DEBP later this am. When mom has rested and willing to pump.  Patient Name: Madison Bowman [id number] Date: 12/19/2015 Reason for consult: Initial assessment   Maternal Data Has patient been taught Hand Expression?: Yes Does the patient have breastfeeding experience prior to this delivery?: No  Feeding Feeding Type: Breast Fed Length of feed: 0 min  LATCH Score/Interventions Latch: Too sleepy or reluctant, no latch achieved, no sucking elicited. (baby sleepy and reluctant to latch)                    Lactation Tools Discussed/Used Tools: Pump;Shells Shell Type: Inverted Breast pump type: Double-Electric Breast Pump   Consult Status Consult Status: Follow-up Date: 12/19/15 Follow-up type: In-patient    Toure Edmonds, Elta Guadeloupe 12/19/2015, 2:10 AM

## 2015-12-19 NOTE — Anesthesia Postprocedure Evaluation (Signed)
Anesthesia Post Note  Patient: Madison Bowman  Procedure(s) Performed: Procedure(s) (LRB): CESAREAN SECTION (N/A)  Patient location during evaluation: PACU Anesthesia Type: Epidural Level of consciousness: oriented and awake and alert Pain management: pain level controlled Vital Signs Assessment: post-procedure vital signs reviewed and stable Respiratory status: spontaneous breathing, respiratory function stable and patient connected to nasal cannula oxygen Cardiovascular status: blood pressure returned to baseline and stable Postop Assessment: no headache, no backache and epidural receding Anesthetic complications: no     Last Vitals:  Vitals:   12/18/15 2336 12/18/15 2351  BP: 121/78 112/67  Pulse: 89 88  Resp: 16 18  Temp:  37.9 C    Last Pain:  Vitals:   12/18/15 2351  TempSrc: Oral  PainSc: 0-No pain   Pain Goal: Patients Stated Pain Goal: 4 (12/18/15 1438)               Disha Cottam J

## 2015-12-19 NOTE — Progress Notes (Signed)
Went at 0500 to get patient up for orthostatic vitals. Pt began to get nervous and started to feel dizzy. Was able to obtain laying and sitting BPs. The patient and I did not feel comfortable standing at this time.

## 2015-12-19 NOTE — Lactation Note (Signed)
This note was copied from a baby's chart. Lactation Consultation Note  Patient Name: Madison Bowman ZOXWR'UToday's Date: 12/19/2015 Reason for consult: Follow-up assessment   With this mom of a term SGA baby, weighing 4 lbs 8 oz, and now 17 hours old. Mom was not sure when baby had fed last, and had not been pumping on a schedule. I helped mom with setting up a schedule to feed and pump. I wrote this on her erase board. Mom is to feed at least every 3 hours, breast feed first, limit to 15 minutes, then offer EBm/formula by bottle, and then pump. Mom also advised to keep up feeding diary. I showed mom how to care for her pump parts, how to set initiation setting, and how to hand express. Mom has a few large drops of colostrum to finger feed to the baby with the next feeding. Mom knows to call for questions/concerns.    Maternal Data    Feeding Feeding Type: Bottle Fed - Formula Nipple Type: Slow - flow Length of feed: 5 min  LATCH Score/Interventions Latch: Repeated attempts needed to sustain latch, nipple held in mouth throughout feeding, stimulation needed to elicit sucking reflex. Intervention(s): Adjust position;Assist with latch  Audible Swallowing: A few with stimulation Intervention(s): Skin to skin;Hand expression  Type of Nipple: Flat Intervention(s): Shells;Double electric pump  Comfort (Breast/Nipple): Soft / non-tender     Hold (Positioning): Full assist, staff holds infant at breast  LATCH Score: 5  Lactation Tools Discussed/Used Tools: Flanges Flange Size:  (decreased to 21 flanges with good fit) Pump Review: Setup, frequency, and cleaning;Milk Storage;Other (comment) (hand expression)   Consult Status Consult Status: Follow-up Date: 12/20/15 Follow-up type: In-patient    Madison Bowman, Madison Bowman 12/19/2015, 2:50 PM

## 2015-12-20 ENCOUNTER — Inpatient Hospital Stay (HOSPITAL_COMMUNITY): Admission: RE | Admit: 2015-12-20 | Payer: BLUE CROSS/BLUE SHIELD | Source: Ambulatory Visit

## 2015-12-20 ENCOUNTER — Encounter (HOSPITAL_COMMUNITY): Payer: Self-pay | Admitting: Family Medicine

## 2015-12-20 LAB — BIRTH TISSUE RECOVERY COLLECTION (PLACENTA DONATION)

## 2015-12-20 MED ORDER — PRENATAL MULTIVITAMIN CH: 1.0000 | ORAL_TABLET | Freq: Every day | ORAL | 0 refills | Status: DC

## 2015-12-20 MED ORDER — IBUPROFEN 600 MG PO TABS: 600.0000 mg | ORAL_TABLET | Freq: Four times a day (QID) | ORAL | 0 refills | Status: DC

## 2015-12-20 MED ORDER — SENNOSIDES-DOCUSATE SODIUM 8.6-50 MG PO TABS: 2.0000 | ORAL_TABLET | ORAL | 0 refills | Status: DC

## 2015-12-20 MED ORDER — OXYCODONE-ACETAMINOPHEN 5-325 MG PO TABS: 1.0000 | ORAL_TABLET | ORAL | 0 refills | Status: DC | PRN

## 2015-12-20 NOTE — Discharge Summary (Signed)
OB Discharge Summary     Patient Name: Madison Bowman DOB: [birth year] MRN: [id number]  Date of admission: 12/18/2015 Delivering MD: Levie HeritageSTINSON, JACOB J   Date of discharge: 12/21/2015  Admitting diagnosis: LABOR Intrauterine pregnancy: 2079w0d     Secondary diagnosis:  Active Problems:   Active labor at term  Additional problems: GDMA1, IUGR 20%     Discharge diagnosis: Term Pregnancy Delivered and GDM A1                                                                                                Post partum procedures:none  Augmentation: AROM  Complications: None  Hospital course:  Induction of Labor With Cesarean Section  30 y.o. yo G4P0030 at 4179w0d was admitted to the hospital 12/18/2015 for induction of labor. Patient had a labor course significant for late and prolonged decelerations. The patient went for cesarean section due to Non-Reassuring FHR, and delivered a Viable infant, Membrane Rupture Time/Date: )5:52 PM ,12/18/2015   Details of operation can be found in separate operative Note.  Patient had an uncomplicated postpartum course. She is ambulating, tolerating a regular diet, passing flatus, and urinating well.  Patient is discharged home in stable condition on 12/21/15.                                   Pt did received on OnQ pump post-operatively which helped significantly with her pain.   Physical exam  Vitals:   12/20/15 0720 12/20/15 1942 12/21/15 0532 12/21/15 0637  BP: 112/69 137/85  (!) 163/95  Pulse: 61 76 70   Resp: 18 18 20    Temp: 98.2 F (36.8 C) 98.2 F (36.8 C) 97.7 F (36.5 C)   TempSrc: Oral Oral    SpO2:      Weight:      Height:       General: alert, cooperative and no distress Lochia: appropriate Uterine Fundus: firm Incision: Healing well with no significant drainage, No significant erythema, dressing intact with some serosanguineous fluid DVT Evaluation: No evidence of DVT seen on physical exam. Negative Homan's sign. Labs: Lab Results   Component Value Date   WBC 28.6 (H) 12/19/2015   HGB 11.2 (L) 12/19/2015   HCT 33.1 (L) 12/19/2015   MCV 78.6 12/19/2015   PLT 309 12/19/2015   CMP Latest Ref Rng & Units 10/12/2015  Glucose 65 - 104 mg/dL 85    Discharge instruction: per After Visit Summary and "Baby and Me Booklet".  After visit meds:    Medication List    STOP taking these medications   BAYER MICROLET LANCETS lancets   glucose blood test strip     TAKE these medications   calcium carbonate 500 MG chewable tablet Commonly known as:  TUMS - dosed in mg elemental calcium Chew 1 tablet by mouth daily as needed for indigestion or heartburn.   CLEAR EYES COMPLETE OP Apply 1 drop to eye daily as needed (for eye dryness or irritation).   ibuprofen 600 MG tablet Commonly known  as:  ADVIL,MOTRIN Take 1 tablet (600 mg total) by mouth every 6 (six) hours.   oxyCODONE-acetaminophen 5-325 MG tablet Commonly known as:  PERCOCET/ROXICET Take 1 tablet by mouth every 4 (four) hours as needed (pain scale 4-7).   prenatal multivitamin Tabs tablet Take 1 tablet by mouth daily at 12 noon.   senna-docusate 8.6-50 MG tablet Commonly known as:  Senokot-S Take 2 tablets by mouth daily.   sertraline 100 MG tablet Commonly known as:  ZOLOFT Take 200 mg by mouth at bedtime.       Diet: routine diet  Activity: Advance as tolerated. Pelvic rest for 6 weeks.   Outpatient follow up:6 weeks Follow up Appt: Future Appointments Date Time Provider Department Center  01/29/2016 2:40 PM Levie HeritageJacob J Stinson, DO [facility]   Follow up Visit: Follow-up Information    [facility] Schedule an appointment as soon as possible for a visit in 6 week(s).   Specialty:  Obstetrics and Gynecology Why:  postpartum visit Contact information: [address] [place] [id number] [phone]       [facility] .   Why:  as needed for potential emergencies Contact  information: [address] [place] [phone]          Postpartum contraception: Progesterone only pills  Newborn Data: Live born female  Birth Weight: 4 lb 8.8 oz (2065 g) APGAR: 4, 8  Baby Feeding: Breast Disposition:home with mother   12/21/2015 Leland HerElsia J Yoo, DO PGY-1  OB FELLOW DISCHARGE ATTESTATION  I have seen and examined this patient and agree with above documentation in the resident's note.   Ernestina PennaNicholas Hinley Brimage, MD 10:29 AM

## 2015-12-20 NOTE — Lactation Note (Addendum)
This note was copied from a baby's chart. Lactation Consultation Note  Patient Name: Girl Dorthula Perfectna Beidleman ZOXWR'UToday's Date: 12/20/2015 Reason for consult: Follow-up assessment;Infant < 6lbs;Difficult latch Baby 35 hours old. Called to assist with latching baby because baby fussy at breast. Parents report that baby has been receiving supplementation by bottle, and mom has been pumping. Mom reports that she was able to pump 1 ml earlier this morning. Discussed with parents that baby is fussy at breast because she is looking for the flow of milk that she was receiving with the bottle earlier. So, asked mom if she would like to be set up with SNS and after discussing with FOB, she agreed. When returned to room with supplies, mom sobbing. Asked bedside nurse, Tamela OddiBetsy, to enc parents to supplement baby now with bottle and [facility] with SNS set up at next feeding.   Maternal Data    Feeding Feeding Type: Formula Length of feed: 10 min  LATCH Score/Interventions                      Lactation Tools Discussed/Used     Consult Status Consult Status: Follow-up Date: 12/20/15 Follow-up type: In-patient    Sherlyn HayJennifer D Jimia Gentles 12/20/2015, 9:24 AM

## 2015-12-20 NOTE — Lactation Note (Signed)
This note was copied from a baby's chart. Lactation Consultation Note  Patient Name: Madison Bowman WUJWJ'XToday's Date: 12/20/2015 Reason for consult: Follow-up assessment;Infant < 6lbs;Difficult latch Baby 43 hours old. Mom attempting to nurse baby when this LC entered the room. Assisted mom with latching, and LC can latch and baby sustain deep latch. However, mom having difficulty keeping baby latched at breast. Parents report that baby has been at the breast for 30 minutes, attempting to nurse, and now is very fussy. Demonstrated to parents how to supplement baby using finger and curve-tipped syringe. Baby took 2 ml of EBM mom had at bedside and tolerated well. FOB then gave formula by bottle according to supplementation guidelines, which were given with review.   Plan is for mom to put baby to breast with cues, asking for assistance with latching as needed. Enc FOB to supplement baby with EBM/formula according to supplementation guidelines while mom uses DEBP. Parents states that they understand this plan and believe it is what they want to do at this time. Mom had expressed that she may decide to pump and bottle-feed. Discussed benefits of having baby at breast, and progression of milk coming to volume. Discussed assessment and interventions with patient's bedside nurse, Tamela OddiBetsy, RN.  Maternal Data    Feeding Feeding Type: Breast Fed Length of feed: 30 min  LATCH Score/Interventions Latch: Repeated attempts needed to sustain latch, nipple held in mouth throughout feeding, stimulation needed to elicit sucking reflex. Intervention(s): Adjust position;Assist with latch;Breast compression  Audible Swallowing: A few with stimulation Intervention(s): Skin to skin;Hand expression  Type of Nipple: Flat (everts with stimulation, no real defined shaft) Intervention(s): Double electric pump  Comfort (Breast/Nipple): Soft / non-tender     Hold (Positioning): Assistance needed to correctly position  infant at breast and maintain latch.  LATCH Score: 6  Lactation Tools Discussed/Used     Consult Status Consult Status: Follow-up Date: 12/21/15 Follow-up type: In-patient    Madison Bowman 12/20/2015, 4:09 PM

## 2015-12-20 NOTE — Progress Notes (Signed)
CSW contacted [place] office to meet with MOB.  CSW screened out consult on 12/19/15(see Bold print below).  MOB was referred for history of depression/anxiety. * Referral screened out by Clinical Social Worker because none of the following criteria appear to apply: ~ History of anxiety/depression during this pregnancy, or of post-partum depression. ~ Diagnosis of anxiety and/or depression within last 3 years OR * MOB's symptoms currently being treated with medication and/or therapy. Please contact the Clinical Social Worker if needs arise, or if MOB requests.  Blaine HamperAngel Bowman, MSW, LCSW Clinical Social Work [phone] \

## 2015-12-20 NOTE — Progress Notes (Signed)
Subjective: Postpartum Day 2: Cesarean Delivery Patient reports tolerating PO, + flatus, + BM and no problems voiding.    Objective: Vital signs in last 24 hours: Temp:  [97.7 F (36.5 C)-98.4 F (36.9 C)] 98.2 F (36.8 C) (08/23 0720) Pulse Rate:  [61-74] 61 (08/23 0720) Resp:  [18-20] 18 (08/23 0720) BP: (112-125)/(69-80) 112/69 (08/23 0720) SpO2:  [98 %] 98 % (08/22 1145)  Physical Exam:  General: alert, cooperative and no distress Lochia: appropriate Uterine Fundus: firm Incision: healing well, no significant drainage, no dehiscence, no significant erythema, slight serosanguineous drainage present DVT Evaluation: No evidence of DVT seen on physical exam. Negative Homan's sign.   Recent Labs  12/18/15 1524 12/19/15 0542  HGB 12.0 11.2*  HCT 34.4* 33.1*    Assessment/Plan: Status post Cesarean section. Doing well postoperatively.  Continue current care. Likely DC tomorrow  Leland HerElsia J Yoo PGY-1 12/20/2015, 9:23 AM  OB FELLOW POSTPARTUM PROGRESS NOTE ATTESTATION  I have seen and examined this patient and agree with above documentation in the resident's note.   Ernestina PennaNicholas Oliva Montecalvo, MD 2:08 PM

## 2015-12-20 NOTE — Progress Notes (Signed)
I received a call from Social Work to provide emotional support for pt's anxious feelings. I offered support, allowed her to voice her fears of her baby being small; normalized the fatigue and overwhelming feelings; affirmed that breast-feeding can be a challenge and encouraged her to be gentle and patient with herself over the coming days, weeks and months.  She has very good support from her husband and from her parents, her in-laws and her sister.  Chaplain Dyanne CarrelKaty Richell Corker, Bcc Pager, [phone] 11:49 AM    12/20/15 1100  Clinical Encounter Type  Visited With Patient and family together  Visit Type Spiritual support  Referral From Social work  Spiritual Encounters  Spiritual Needs Emotional  Stress Factors  Patient Stress Factors Major life changes

## 2015-12-20 NOTE — Discharge Instructions (Signed)
Cesarean Delivery, Care After  Refer to this sheet in the next few weeks. These instructions provide you with information on caring for yourself after your procedure. Your health care provider may also give you specific instructions. Your treatment has been planned according to current medical practices, but problems sometimes occur. Call your health care provider if you have any problems or questions after you go home.  HOME CARE INSTRUCTIONS   Only take over-the-counter or prescription medications as directed by your health care provider.   Do not drink alcohol, especially if you are breastfeeding or taking medication to relieve pain.   Do not chew or smoke tobacco.   Continue to use good perineal care. Good perineal care includes:    Wiping your perineum from front to back.    Keeping your perineum clean.   Check your surgical cut (incision) daily for increased redness, drainage, swelling, or separation of skin.   Clean your incision gently with soap and water every day, and then pat it dry. If your health care provider says it is okay, leave the incision uncovered. Use a bandage (dressing) if the incision is draining fluid or appears irritated. If the adhesive strips across the incision do not fall off within 7 days, carefully peel them off.   Hug a pillow when coughing or sneezing until your incision is healed. This helps to relieve pain.   Do not use tampons or douche until your health care provider says it is okay.   Shower, wash your hair, and take tub baths as directed by your health care provider.   Wear a well-fitting bra that provides breast support.   Limit wearing support panties or control-top hose.   Drink enough fluids to keep your urine clear or pale yellow.   Eat high-fiber foods such as whole grain cereals and breads, brown rice, beans, and fresh fruits and vegetables every day. These foods may help prevent or relieve constipation.   Resume activities such as climbing stairs,  driving, lifting, exercising, or traveling as directed by your health care provider.   Talk to your health care provider about resuming sexual activities. This is dependent upon your risk of infection, your rate of healing, and your comfort and desire to resume sexual activity.   Try to have someone help you with your household activities and your newborn for at least a few days after you leave the hospital.   Rest as much as possible. Try to rest or take a nap when your newborn is sleeping.   Increase your activities gradually.   Keep all of your scheduled postpartum appointments. It is very important to keep your scheduled follow-up appointments. At these appointments, your health care provider will be checking to make sure that you are healing physically and emotionally.  SEEK MEDICAL CARE IF:    You are passing large clots from your vagina. Save any clots to show your health care provider.   You have a foul smelling discharge from your vagina.   You have trouble urinating.   You are urinating frequently.   You have pain when you urinate.   You have a change in your bowel movements.   You have increasing redness, pain, or swelling near your incision.   You have pus draining from your incision.   Your incision is separating.   You have painful, hard, or reddened breasts.   You have a severe headache.   You have blurred vision or see spots.   You feel sad   or depressed.   You have thoughts of hurting yourself or your newborn.   You have questions about your care, the care of your newborn, or medications.   You are dizzy or light-headed.   You have a rash.   You have pain, redness, or swelling at the site of the removed intravenous access (IV) tube.   You have nausea or vomiting.   You stopped breastfeeding and have not had a menstrual period within 12 weeks of stopping.   You are not breastfeeding and have not had a menstrual period within 12 weeks of delivery.   You have a fever.  SEEK  IMMEDIATE MEDICAL CARE IF:   You have persistent pain.   You have chest pain.   You have shortness of breath.   You faint.   You have leg pain.   You have stomach pain.   Your vaginal bleeding saturates 2 or more sanitary pads in 1 hour.  MAKE SURE YOU:    Understand these instructions.   Will watch your condition.   Will get help right away if you are not doing well or get worse.     This information is not intended to replace advice given to you by your health care provider. Make sure you discuss any questions you have with your health care provider.     Document Released: 01/05/2002 Document Revised: 05/06/2014 Document Reviewed: 12/11/2011  Elsevier Interactive Patient Education 2016 [employer]

## 2015-12-21 ENCOUNTER — Encounter: Payer: Self-pay | Admitting: Family Medicine

## 2015-12-21 MED ORDER — OXYCODONE-ACETAMINOPHEN 5-325 MG PO TABS: 1.0000 | ORAL_TABLET | ORAL | 0 refills | Status: DC | PRN

## 2016-01-02 DIAGNOSIS — F422 Mixed obsessional thoughts and acts: Secondary | ICD-10-CM | POA: Diagnosis not present

## 2016-01-02 DIAGNOSIS — F411 Generalized anxiety disorder: Secondary | ICD-10-CM | POA: Diagnosis not present

## 2016-01-02 DIAGNOSIS — F410 Panic disorder [episodic paroxysmal anxiety] without agoraphobia: Secondary | ICD-10-CM | POA: Diagnosis not present

## 2016-01-06 DIAGNOSIS — F410 Panic disorder [episodic paroxysmal anxiety] without agoraphobia: Secondary | ICD-10-CM | POA: Diagnosis not present

## 2016-01-09 DIAGNOSIS — F410 Panic disorder [episodic paroxysmal anxiety] without agoraphobia: Secondary | ICD-10-CM | POA: Diagnosis not present

## 2016-01-16 DIAGNOSIS — F4322 Adjustment disorder with anxiety: Secondary | ICD-10-CM | POA: Diagnosis not present

## 2016-01-16 DIAGNOSIS — F410 Panic disorder [episodic paroxysmal anxiety] without agoraphobia: Secondary | ICD-10-CM | POA: Diagnosis not present

## 2016-01-29 ENCOUNTER — Encounter: Payer: Self-pay | Admitting: Family Medicine

## 2016-01-29 ENCOUNTER — Ambulatory Visit (INDEPENDENT_AMBULATORY_CARE_PROVIDER_SITE_OTHER): Payer: BLUE CROSS/BLUE SHIELD | Admitting: Family Medicine

## 2016-01-29 NOTE — Progress Notes (Signed)
Subjective:     Madison Bowman is a 30 y.o. female who presents for a postpartum visit. She is 6 weeks postpartum following a low cervical transverse Cesarean section. I have fully reviewed the prenatal and intrapartum course. The delivery was at 38  gestational weeks. Outcome: C-section. Anesthesia: Epidual. Postpartum course has been uncompicated. Baby's course has been uncomplicated. Baby is feeding by breast. Bleeding not at this time. Bowel function is normal. Bladder function is normal. Patient is not sexually active. Contraception method is discuss options. Postpartum depression screening:negative   The following portions of the patient's history were reviewed and updated as appropriate: allergies, current medications, past family history, past medical history, past social history, past surgical history and problem list.  Review of Systems Pertinent items noted in HPI and remainder of comprehensive ROS otherwise negative.   Objective:    BP 136/78   Pulse 85   Wt (!) 1365 lb (619.2 kg)   LMP  (LMP Unknown)   BMI 253.74 kg/m   General:  alert, cooperative and no distress  Lungs: clear to auscultation bilaterally  Heart:  regular rate and rhythm, S1, S2 normal, no murmur, click, rub or gallop  Abdomen: soft, non-tender; bowel sounds normal; no masses,  no organomegaly        Assessment:     Normal postpartum exam. Pap smear not done at today's visit.   Plan:    1. Contraception: IUD - insert on Friday 2. Follow up in: 1 year or as needed.

## 2016-02-02 ENCOUNTER — Ambulatory Visit: Payer: BLUE CROSS/BLUE SHIELD | Admitting: Family Medicine

## 2016-03-01 ENCOUNTER — Ambulatory Visit: Payer: BLUE CROSS/BLUE SHIELD | Admitting: Family Medicine

## 2016-03-01 ENCOUNTER — Telehealth: Payer: Self-pay

## 2016-03-01 NOTE — Telephone Encounter (Signed)
Pt called in regards to missed appt for IUD insertion.  LM to call and reschedule appt and if she had any questions to please give us a call.

## 2016-10-15 DIAGNOSIS — F410 Panic disorder [episodic paroxysmal anxiety] without agoraphobia: Secondary | ICD-10-CM | POA: Diagnosis not present

## 2016-11-05 DIAGNOSIS — L811 Chloasma: Secondary | ICD-10-CM | POA: Diagnosis not present

## 2016-11-05 DIAGNOSIS — D2262 Melanocytic nevi of left upper limb, including shoulder: Secondary | ICD-10-CM | POA: Diagnosis not present

## 2016-11-05 DIAGNOSIS — L810 Postinflammatory hyperpigmentation: Secondary | ICD-10-CM | POA: Diagnosis not present

## 2016-11-05 DIAGNOSIS — D2372 Other benign neoplasm of skin of left lower limb, including hip: Secondary | ICD-10-CM | POA: Diagnosis not present

## 2016-12-23 DIAGNOSIS — R509 Fever, unspecified: Secondary | ICD-10-CM | POA: Diagnosis not present

## 2016-12-23 DIAGNOSIS — J0100 Acute maxillary sinusitis, unspecified: Secondary | ICD-10-CM | POA: Diagnosis not present

## 2017-01-03 DIAGNOSIS — J0100 Acute maxillary sinusitis, unspecified: Secondary | ICD-10-CM | POA: Diagnosis not present

## 2017-01-03 DIAGNOSIS — J029 Acute pharyngitis, unspecified: Secondary | ICD-10-CM | POA: Diagnosis not present

## 2017-01-14 DIAGNOSIS — L680 Hirsutism: Secondary | ICD-10-CM | POA: Diagnosis not present

## 2017-01-14 DIAGNOSIS — L810 Postinflammatory hyperpigmentation: Secondary | ICD-10-CM | POA: Diagnosis not present

## 2017-06-27 ENCOUNTER — Ambulatory Visit (INDEPENDENT_AMBULATORY_CARE_PROVIDER_SITE_OTHER): Payer: BLUE CROSS/BLUE SHIELD | Admitting: Family Medicine

## 2017-06-27 ENCOUNTER — Encounter: Payer: Self-pay | Admitting: Family Medicine

## 2017-06-27 VITALS — BP 118/74 | HR 79 | Temp 98.1°F | Ht 61.5 in | Wt 130.2 lb

## 2017-06-27 DIAGNOSIS — F411 Generalized anxiety disorder: Secondary | ICD-10-CM

## 2017-06-27 DIAGNOSIS — F17200 Nicotine dependence, unspecified, uncomplicated: Secondary | ICD-10-CM | POA: Diagnosis not present

## 2017-06-27 DIAGNOSIS — Z0001 Encounter for general adult medical examination with abnormal findings: Secondary | ICD-10-CM | POA: Diagnosis not present

## 2017-06-27 DIAGNOSIS — Z13220 Encounter for screening for lipoid disorders: Secondary | ICD-10-CM

## 2017-06-27 NOTE — Assessment & Plan Note (Signed)
Patient was asked about her tobacco use today and was strongly advised to quit. Patient is currently contemplative. We reviewed treatment options to assist her quit smoking including NRT, Chantix, and Bupropion.  Patient does not want to start either Chantix or Wellbutrin today.  She will be looking into nicotine replacement options.  Follow-up at next office visit.  Total time spent counseling approximately 5 minutes.

## 2017-06-27 NOTE — Progress Notes (Signed)
Subjective:  Madison Bowman is a 32 y.o. female who presents today for her annual comprehensive physical exam and to establish care.  HPI:  She has no acute complaints today.   She has one stable, chronic problem summarized below:  Anxiety.  Several year history.  On Zoloft 200 mg daily.  Also uses Xanax 0.25 mg twice daily as needed anxiety attacks.  Followed by psychiatry-Dr. Lafayette Dragonarr.  Nicotine dependence, new problem Several year history.  Smokes about half pack per day.  Wants to quit.  Not interested in Wellbutrin or Chantix.  Lifestyle Diet: No specific diet. Exercise: Does not exercise  [facility]2017  Decreased Interest 0  Down, Depressed, Hopeless 0  PHQ - 2 Score 0  Altered sleeping -  Tired, decreased energy -  Change in appetite -  Feeling bad or failure about yourself  -  Trouble concentrating -  Moving slowly or fidgety/restless -  Suicidal thoughts -  PHQ-9 Score -   There are no preventive care reminders to display for this patient.   ROS: Per HPI, otherwise a complete review of systems was negative.   PMH:  The following were reviewed and entered/updated in epic: Past Medical History:  Diagnosis Date  . Anxiety    Patient Active Problem List   Diagnosis Date Noted  . Generalized anxiety disorder 06/27/2017  . Nicotine dependence with current use 07/12/2015   Past Surgical History:  Procedure Laterality Date  . CESAREAN SECTION N/A 12/18/2015   Procedure: CESAREAN SECTION;  Surgeon: Levie HeritageJacob J Stinson, DO;  [facility];  Service: Obstetrics;  Laterality: N/A;  . NO PAST SURGERIES     Family History  Problem Relation Age of Onset  . Thyroid disease Mother   . Heart disease Paternal Grandfather   . Depression Maternal Grandfather    Medications- reviewed and updated Current Outpatient Medications  Medication Sig Dispense Refill  . sertraline (ZOLOFT) 100 MG tablet Take 200 mg by mouth at bedtime.      No  current facility-administered medications for this visit.    Allergies-reviewed and updated Allergies  Allergen Reactions  . Penicillins Rash    Has patient had a PCN reaction causing immediate rash, facial/tongue/throat swelling, SOB or lightheadedness with hypotension: Yes Has patient had a PCN reaction causing severe rash involving mucus membranes or skin necrosis: Yes Has patient had a PCN reaction that required hospitalization No Has patient had a PCN reaction occurring within the last 10 years: No If all of the above answers are "NO", then may proceed with Cephalosporin use.     Social History   Socioeconomic History  . Marital status: Single    Spouse name: None  . Number of children: 1  . Years of education: None  . Highest education level: None  Social Needs  . Financial resource strain: None  . Food insecurity - worry: None  . Food insecurity - inability: None  . Transportation needs - medical: None  . Transportation needs - non-medical: None  Occupational History  . None  Tobacco Use  . Smoking status: Current Every Day Smoker    Packs/day: 0.50    Types: Cigarettes  . Smokeless tobacco: Never Used  Substance and Sexual Activity  . Alcohol use: No  . Drug use: No  . Sexual activity: Yes    Birth control/protection: None  Other Topics Concern  . None  Social History Narrative  . None    Objective:  Physical Exam:  BP 118/74 (BP Location: Left Arm, Patient Position: Sitting, Cuff Size: Normal)   Pulse 79   Temp 98.1 F (36.7 C) (Oral)   Ht 5' 1.5" (1.562 m)   Wt 130 lb 3.2 oz (59.1 kg)   SpO2 97%   BMI 24.20 kg/m   Body mass index is 24.2 kg/m. Wt Readings from Last 3 Encounters:  06/27/17 130 lb 3.2 oz (59.1 kg)  01/29/16 (!) 1365 lb (619.2 kg)  12/18/15 163 lb (73.9 kg)   Gen: NAD, resting comfortably HEENT: TMs normal bilaterally. OP clear. No thyromegaly noted.  CV: RRR with no murmurs appreciated Pulm: NWOB, CTAB with no crackles,  wheezes, or rhonchi GI: Normal bowel sounds present. Soft, Nontender, Nondistended. MSK: no edema, cyanosis, or clubbing noted Skin: warm, dry Neuro: CN2-12 grossly intact. Strength 5/5 in upper and lower extremities. Reflexes symmetric and intact bilaterally.  Psych: Normal affect and thought content  Assessment/Plan:  Nicotine dependence with current use Patient was asked about her tobacco use today and was strongly advised to quit. Patient is currently contemplative. We reviewed treatment options to assist her quit smoking including NRT, Chantix, and Bupropion.  Patient does not want to start either Chantix or Wellbutrin today.  She will be looking into nicotine replacement options.  Follow-up at next office visit.  Total time spent counseling approximately 5 minutes.    Generalized anxiety disorder Stable.  Check CBC, CMET, and TSH with next blood draw.  Defer further management to psychiatry.  Preventative Healthcare: Check lipid panel.  Up-to-date on other preventative healthcare measures.  Patient Counseling:  -Nutrition: Stressed importance of moderation in sodium/caffeine intake, saturated fat and cholesterol, caloric balance, sufficient intake of fresh fruits, vegetables, and fiber.  -Stressed the importance of regular exercise.   -Substance Abuse: Discussed cessation/primary prevention of tobacco, alcohol, or other drug use; driving or other dangerous activities under the influence; availability of treatment for abuse.   -Injury prevention: Discussed safety belts, safety helmets, smoke detector, smoking near bedding or upholstery.   -Sexuality: Discussed sexually transmitted diseases, partner selection, use of condoms, avoidance of unintended pregnancy and contraceptive alternatives.   -Dental health: Discussed importance of regular tooth brushing, flossing, and dental visits.  -Health maintenance and immunizations reviewed. Please refer to Health maintenance section.  Return  to care in 1 year for next preventative visit.   Katina Degree. Jimmey Ralph, MD 06/27/2017 4:37 PM

## 2017-06-27 NOTE — Assessment & Plan Note (Signed)
Stable.  Check CBC, CMET, and TSH with next blood draw.  Defer further management to psychiatry.

## 2017-06-27 NOTE — Patient Instructions (Signed)
Please call 1-800-QUIT-NOW for resources to help you stop smoking.   Preventive Care 18-39 Years, Female Preventive care refers to lifestyle choices and visits with your health care provider that can promote health and wellness. What does preventive care include?  A yearly physical exam. This is also called an annual well check.  Dental exams once or twice a year.  Routine eye exams. Ask your health care provider how often you should have your eyes checked.  Personal lifestyle choices, including: ? Daily care of your teeth and gums. ? Regular physical activity. ? Eating a healthy diet. ? Avoiding tobacco and drug use. ? Limiting alcohol use. ? Practicing safe sex. ? Taking vitamin and mineral supplements as recommended by your health care provider. What happens during an annual well check? The services and screenings done by your health care provider during your annual well check will depend on your age, overall health, lifestyle risk factors, and family history of disease. Counseling Your health care provider may ask you questions about your:  Alcohol use.  Tobacco use.  Drug use.  Emotional well-being.  Home and relationship well-being.  Sexual activity.  Eating habits.  Work and work Statistician.  Method of birth control.  Menstrual cycle.  Pregnancy history.  Screening You may have the following tests or measurements:  Height, weight, and BMI.  Diabetes screening. This is done by checking your blood sugar (glucose) after you have not eaten for a while (fasting).  Blood pressure.  Lipid and cholesterol levels. These may be checked every 5 years starting at age 82.  Skin check.  Hepatitis C blood test.  Hepatitis B blood test.  Sexually transmitted disease (STD) testing.  BRCA-related cancer screening. This may be done if you have a family history of breast, ovarian, tubal, or peritoneal cancers.  Pelvic exam and Pap test. This may be done every  3 years starting at age 43. Starting at age 58, this may be done every 5 years if you have a Pap test in combination with an HPV test.  Discuss your test results, treatment options, and if necessary, the need for more tests with your health care provider. Vaccines Your health care provider may recommend certain vaccines, such as:  Influenza vaccine. This is recommended every year.  Tetanus, diphtheria, and acellular pertussis (Tdap, Td) vaccine. You may need a Td booster every 10 years.  Varicella vaccine. You may need this if you have not been vaccinated.  HPV vaccine. If you are 8 or younger, you may need three doses over 6 months.  Measles, mumps, and rubella (MMR) vaccine. You may need at least one dose of MMR. You may also need a second dose.  Pneumococcal 13-valent conjugate (PCV13) vaccine. You may need this if you have certain conditions and were not previously vaccinated.  Pneumococcal polysaccharide (PPSV23) vaccine. You may need one or two doses if you smoke cigarettes or if you have certain conditions.  Meningococcal vaccine. One dose is recommended if you are age 29-21 years and a first-year college student living in a residence hall, or if you have one of several medical conditions. You may also need additional booster doses.  Hepatitis A vaccine. You may need this if you have certain conditions or if you travel or work in places where you may be exposed to hepatitis A.  Hepatitis B vaccine. You may need this if you have certain conditions or if you travel or work in places where you may be exposed to hepatitis B.  Haemophilus influenzae type b (Hib) vaccine. You may need this if you have certain risk factors.  Talk to your health care provider about which screenings and vaccines you need and how often you need them. This information is not intended to replace advice given to you by your health care provider. Make sure you discuss any questions you have with your health  care provider. Document Released: 06/11/2001 Document Revised: 01/03/2016 Document Reviewed: 02/14/2015 Elsevier Interactive Patient Education  [employer].

## 2017-07-01 DIAGNOSIS — F4010 Social phobia, unspecified: Secondary | ICD-10-CM | POA: Diagnosis not present

## 2017-07-02 ENCOUNTER — Other Ambulatory Visit (INDEPENDENT_AMBULATORY_CARE_PROVIDER_SITE_OTHER): Payer: BLUE CROSS/BLUE SHIELD

## 2017-07-02 DIAGNOSIS — Z13220 Encounter for screening for lipoid disorders: Secondary | ICD-10-CM | POA: Diagnosis not present

## 2017-07-02 DIAGNOSIS — F411 Generalized anxiety disorder: Secondary | ICD-10-CM | POA: Diagnosis not present

## 2017-07-02 LAB — CBC
HCT: 43 % (ref 36.0–46.0)
Hemoglobin: 14.2 g/dL (ref 12.0–15.0)
MCHC: 32.9 g/dL (ref 30.0–36.0)
MCV: 85.2 fl (ref 78.0–100.0)
Platelets: 381 10*3/uL (ref 150.0–400.0)
RBC: 5.05 Mil/uL (ref 3.87–5.11)
RDW: 15.1 % (ref 11.5–15.5)
WBC: 11.5 10*3/uL — ABNORMAL HIGH (ref 4.0–10.5)

## 2017-07-02 LAB — LIPID PANEL
CHOL/HDL RATIO: 4
Cholesterol: 181 mg/dL (ref 0–200)
HDL: 48.8 mg/dL (ref >39.00)
LDL CALC: 111 mg/dL — AB (ref 0–99)
NONHDL: 132.02
TRIGLYCERIDES: 103 mg/dL (ref 0.0–149.0)
VLDL: 20.6 mg/dL (ref 0.0–40.0)

## 2017-07-02 LAB — COMPREHENSIVE METABOLIC PANEL
ALT: 12 U/L (ref 0–35)
AST: 12 U/L (ref 0–37)
Albumin: 4.1 g/dL (ref 3.5–5.2)
Alkaline Phosphatase: 57 U/L (ref 39–117)
BUN: 12 mg/dL (ref 6–23)
CO2: 29 meq/L (ref 19–32)
Calcium: 9.8 mg/dL (ref 8.4–10.5)
Chloride: 104 mEq/L (ref 96–112)
Creatinine, Ser: 0.93 mg/dL (ref 0.40–1.20)
GFR: 74.04 mL/min (ref >60.00)
GLUCOSE: 91 mg/dL (ref 70–99)
POTASSIUM: 4 meq/L (ref 3.5–5.1)
SODIUM: 138 meq/L (ref 135–145)
Total Bilirubin: 0.3 mg/dL (ref 0.2–1.2)
Total Protein: 7.2 g/dL (ref 6.0–8.3)

## 2017-07-02 LAB — TSH: TSH: 1.2 u[IU]/mL (ref 0.35–4.50)

## 2017-07-03 NOTE — Progress Notes (Signed)
Dr Lavone NeriParker's interpretation of your lab work:  Electrolytes, kidney function, and liver function are all normal.  Thyroid test is normal. Blood counts are normal. Your "Bad" cholesterol is a little high, but all of your other cholesterol tests are normal. We do not need to start medications, but you should continue working on exercise and a healthy diet. We can recheck in 1 year.    If you have any additional questions, please give us a call or send us a message through [email or web address].  Take care, Dr Jimmey RalphParker

## 2017-12-18 DIAGNOSIS — F411 Generalized anxiety disorder: Secondary | ICD-10-CM | POA: Diagnosis not present

## 2017-12-18 DIAGNOSIS — F4322 Adjustment disorder with anxiety: Secondary | ICD-10-CM | POA: Diagnosis not present

## 2017-12-18 DIAGNOSIS — F410 Panic disorder [episodic paroxysmal anxiety] without agoraphobia: Secondary | ICD-10-CM | POA: Diagnosis not present

## 2018-01-14 IMAGING — US US OB COMP LESS 14 WK
1 series · 15 of 28 positions shown · non-contrast
Comparison: 01/20/2015

CLINICAL DATA: Early pregnancy with unknown dates, history of
previous spontaneous abortion

EXAM:
OBSTETRIC <14 WK ULTRASOUND
TECHNIQUE: Transabdominal ultrasound was performed for evaluation of the
gestation as well as the maternal uterus and adnexal regions.

[Series 1: us ob comp less 14 wk · 41 acquisitions, 15 frames shown]
[im 1/41]
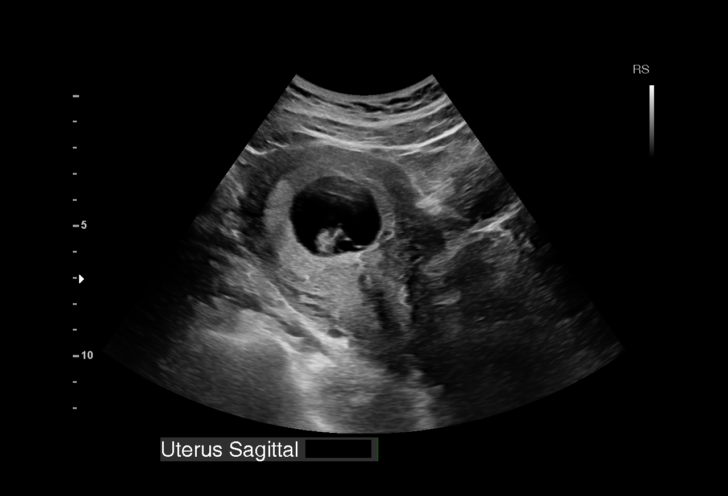
[im 3/41]
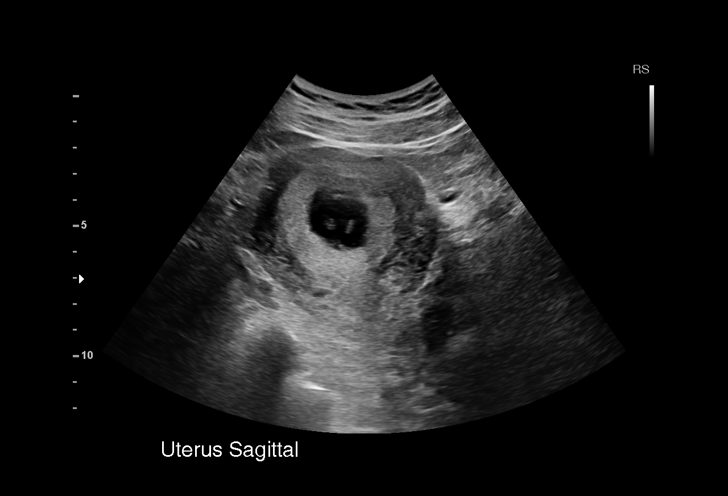
[im 6/41]
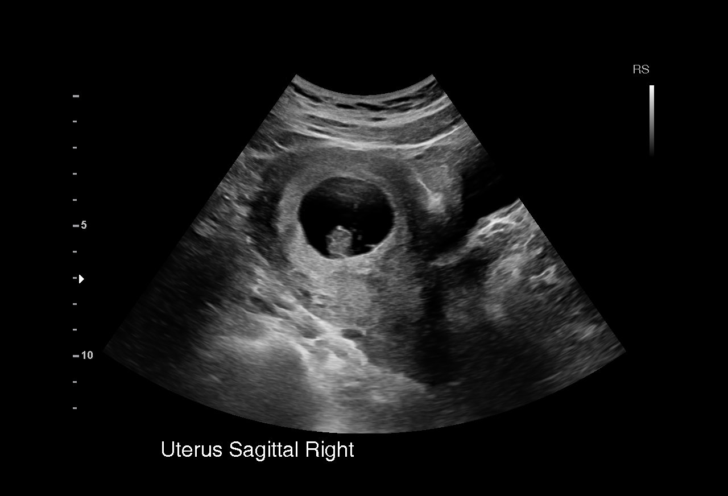
[im 9/41]
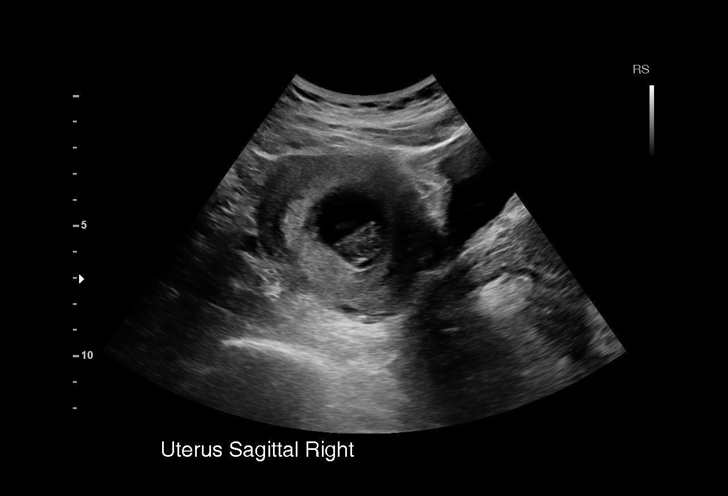
[im 12/41]
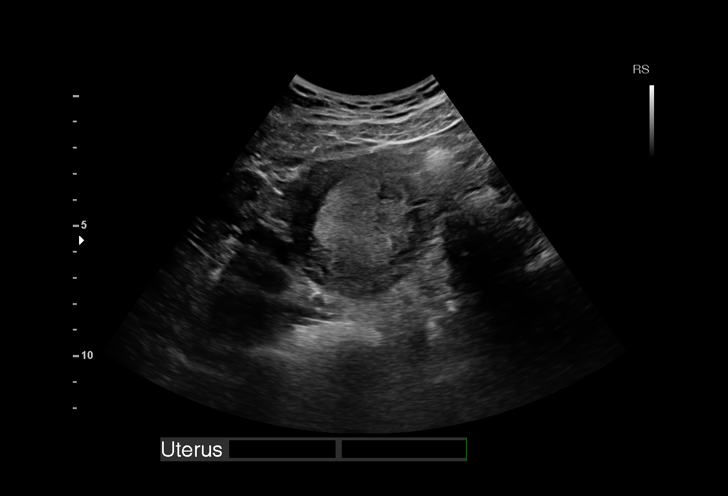
[im 15/41]
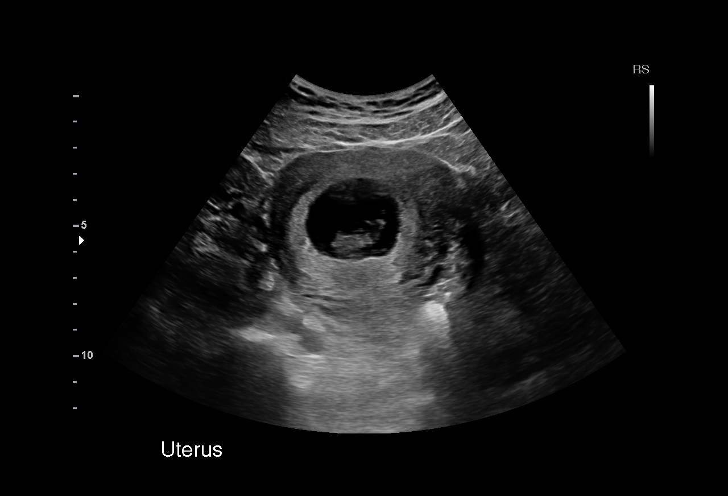
[im 18/41]
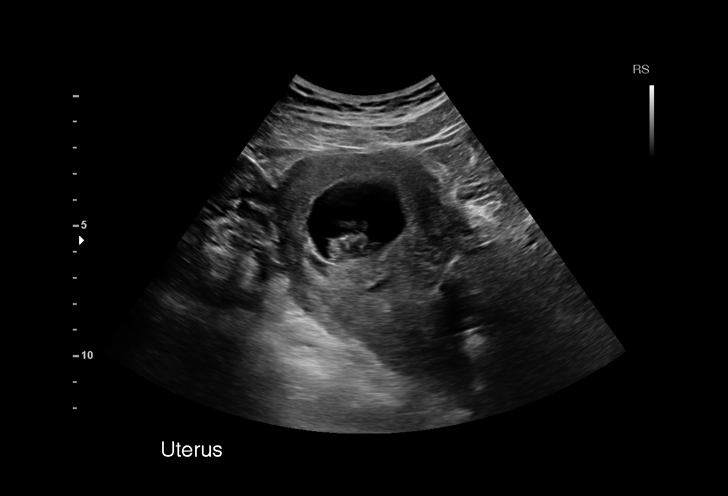
[im 21/41]
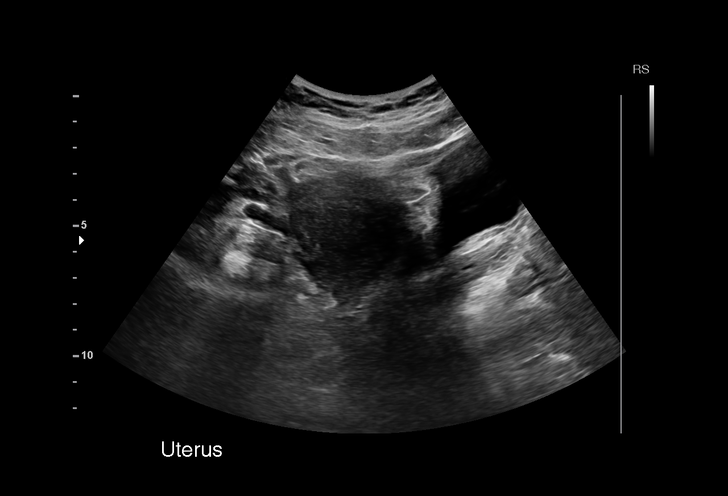
[im 23/41]
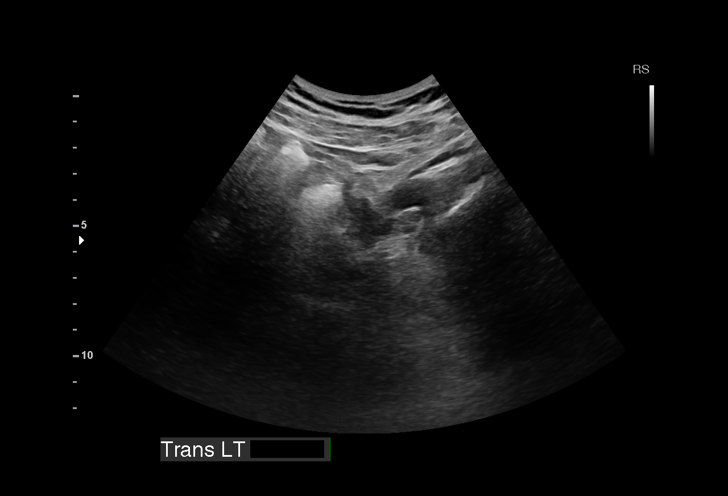
[im 26/41]
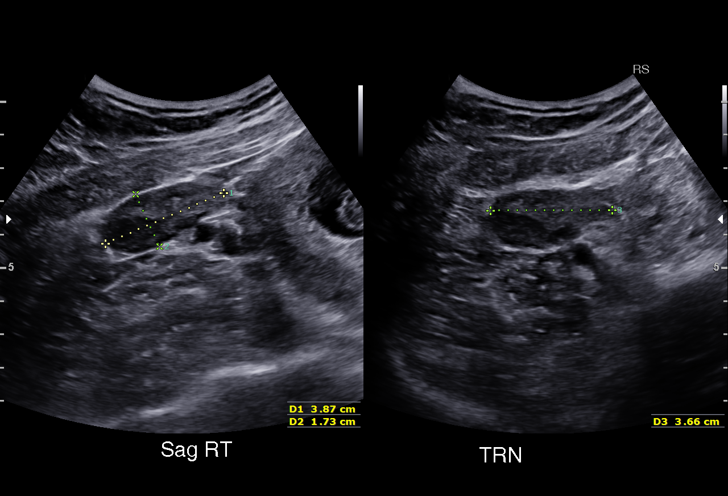
[im 29/41]
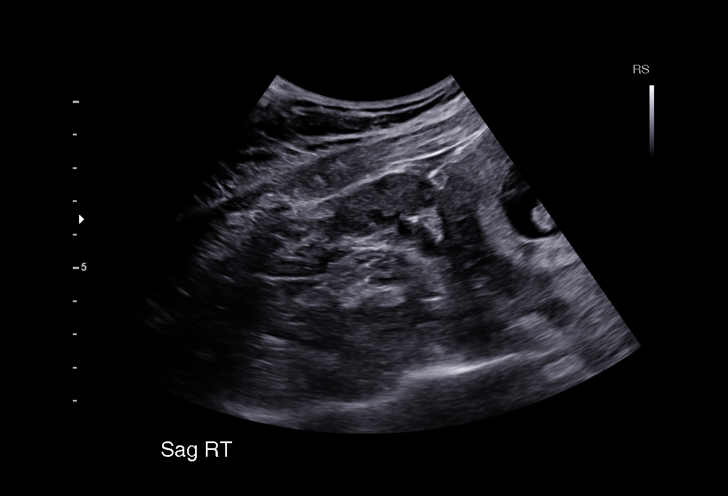
[im 32/41]
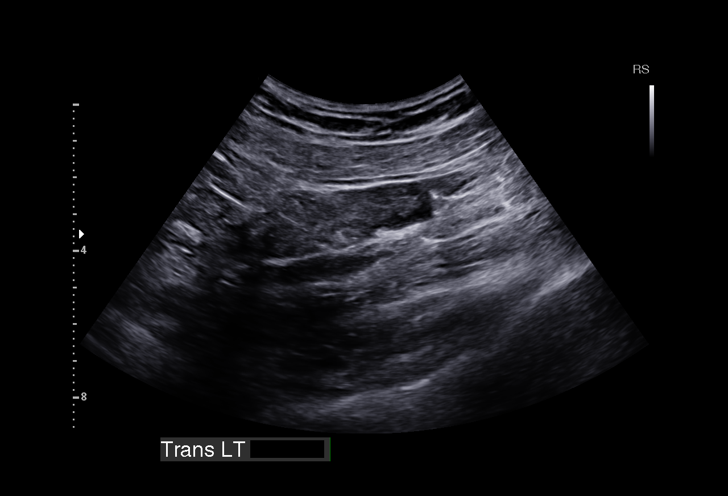
[im 35/41]
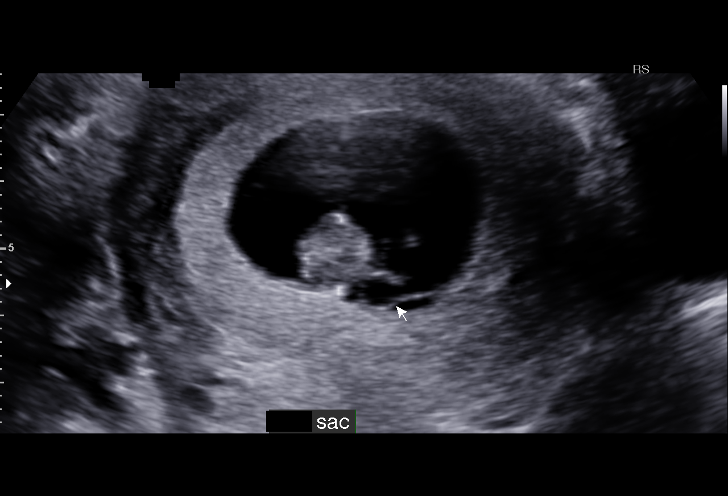
[im 38/41]
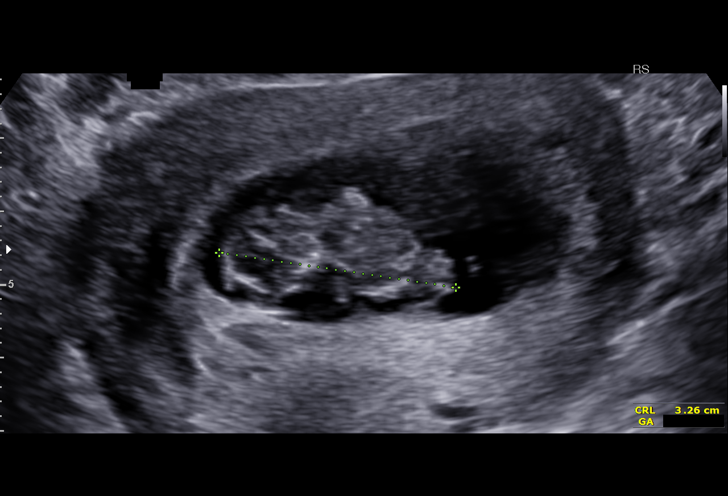
[im 41/41]
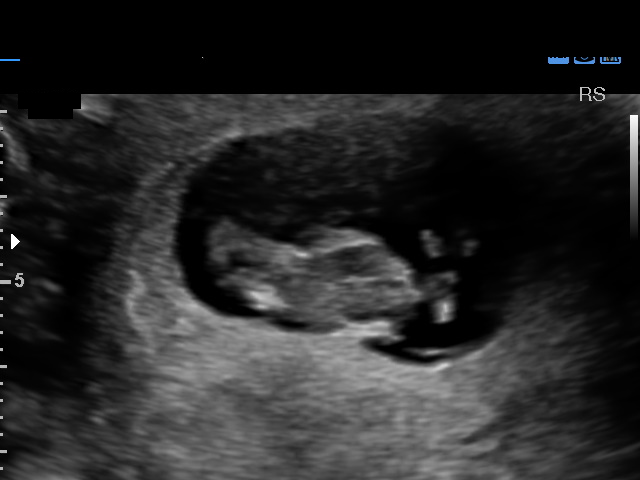

[15 of 28 positions shown; findings below may reference images not displayed]

FINDINGS: Intrauterine gestational sac: Visualized/normal in shape.

Yolk sac:  Present

Embryo:  Present

Cardiac Activity: Present

Heart Rate: 176 bpm

CRL:   3.26  cm   10 w 1 d                  US EDC: 12/27/2015

Subchorionic hemorrhage:  None visualized.

Maternal uterus/adnexae: The uterus and ovaries are within normal
limits. The corpus luteum cyst is noted on the right.
IMPRESSION: Single live intrauterine gestation at 10 weeks 1 day.

## 2018-01-29 DIAGNOSIS — F4010 Social phobia, unspecified: Secondary | ICD-10-CM | POA: Diagnosis not present

## 2018-02-01 IMAGING — US US MFM FETAL NUCHAL TRANSLUCENCY
1 series · 13 of 28 positions shown · non-contrast
Comparison: none

[Series 1: us mfm fetal nuchal translucency · 0.17mm/px · 13 of 36 slices shown]
[im 2/36]
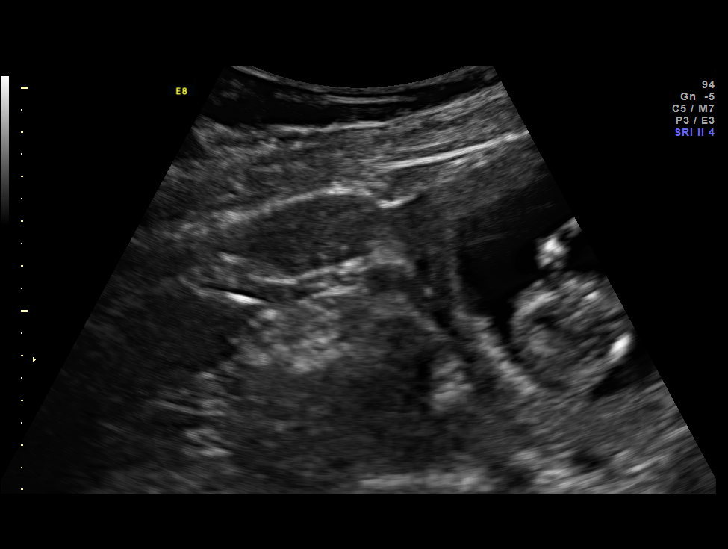
[im 4/36]
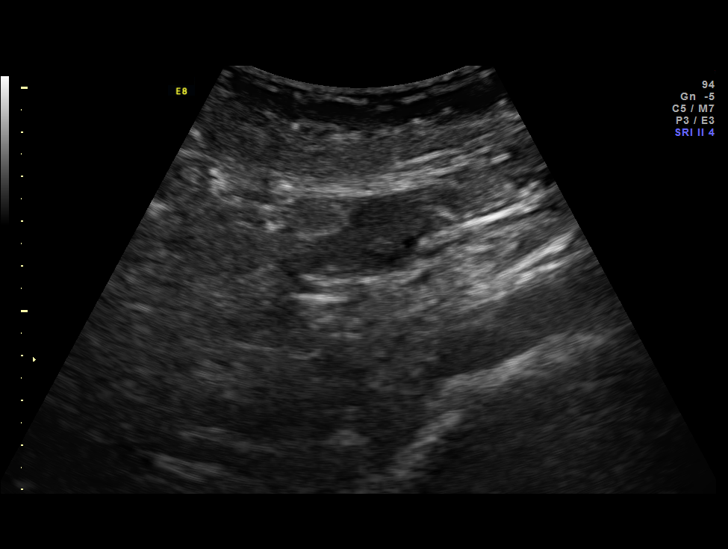
[im 7/36]
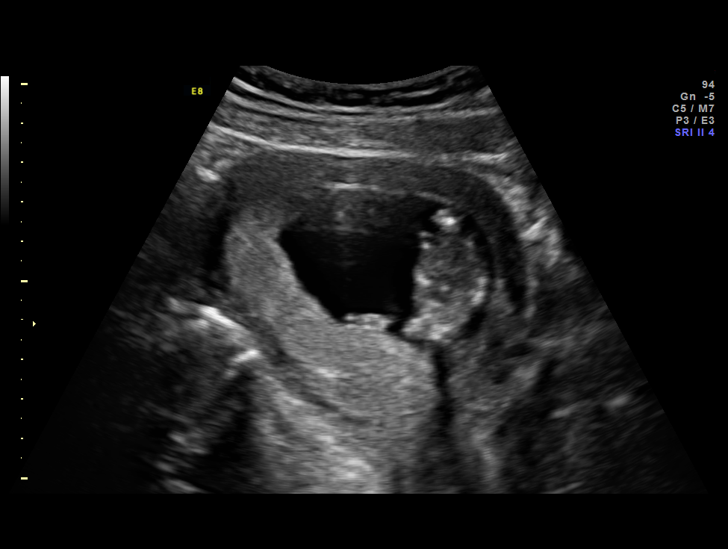
[im 10/36]
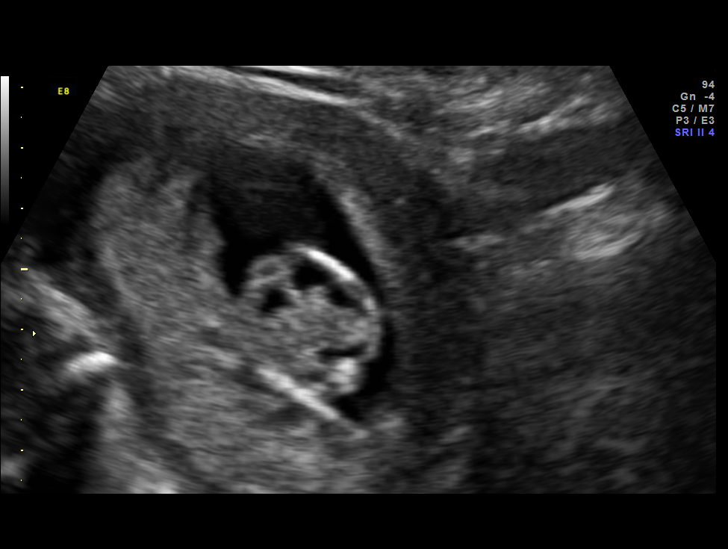
[im 12/36]
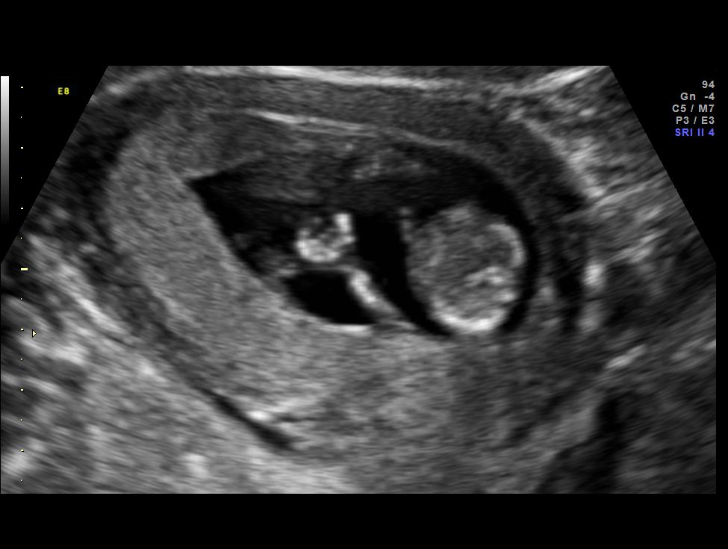
[im 15/36]
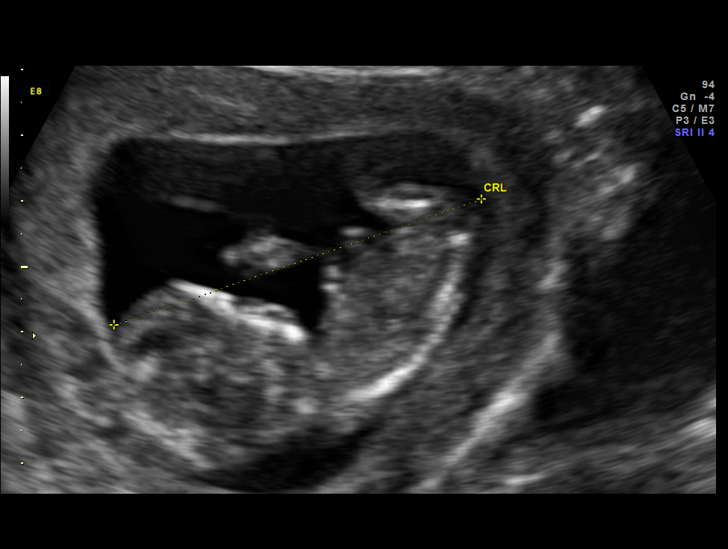
[im 19/36]
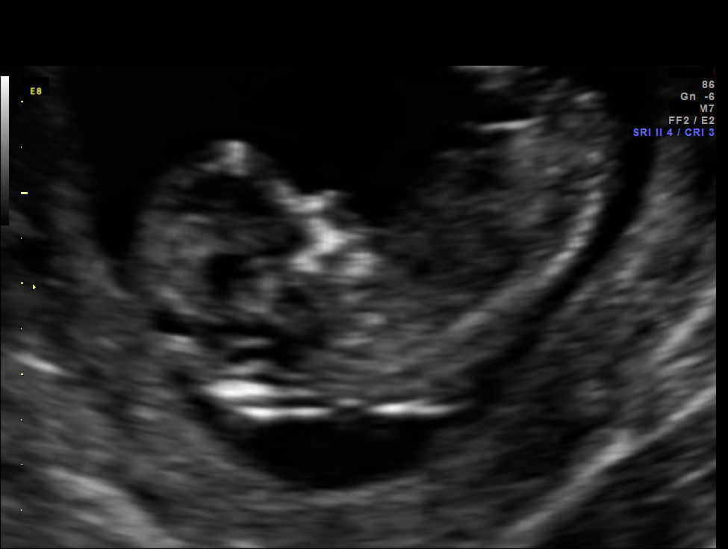
[im 21/36]
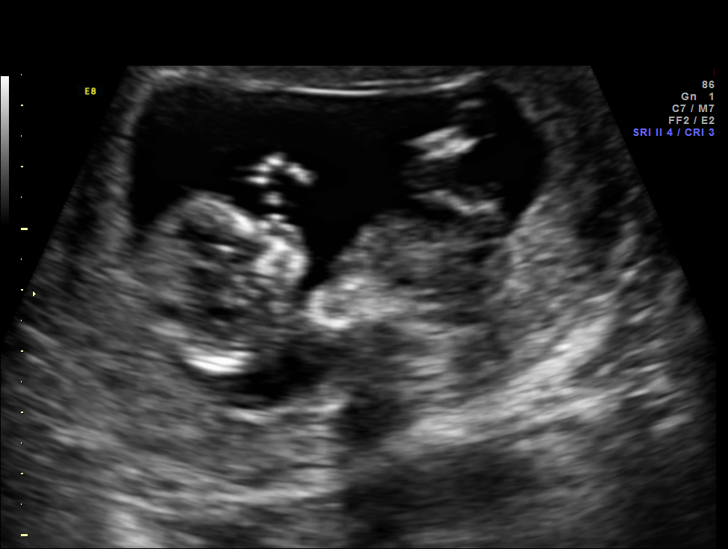
[im 24/36]
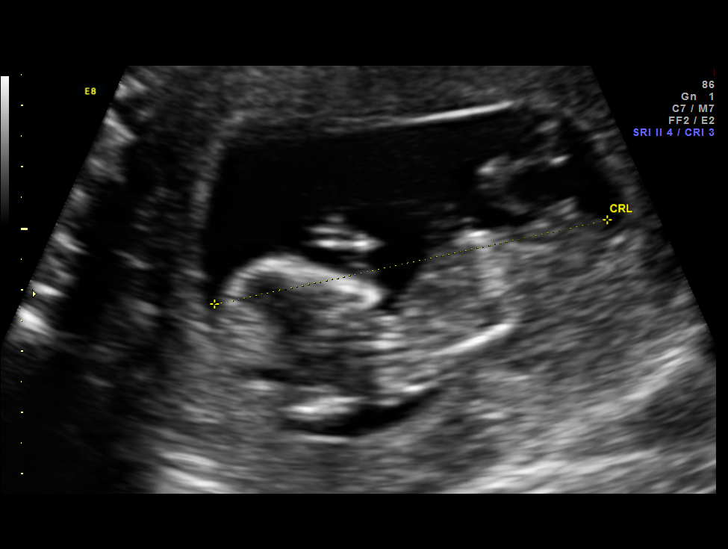
[im 26/36]
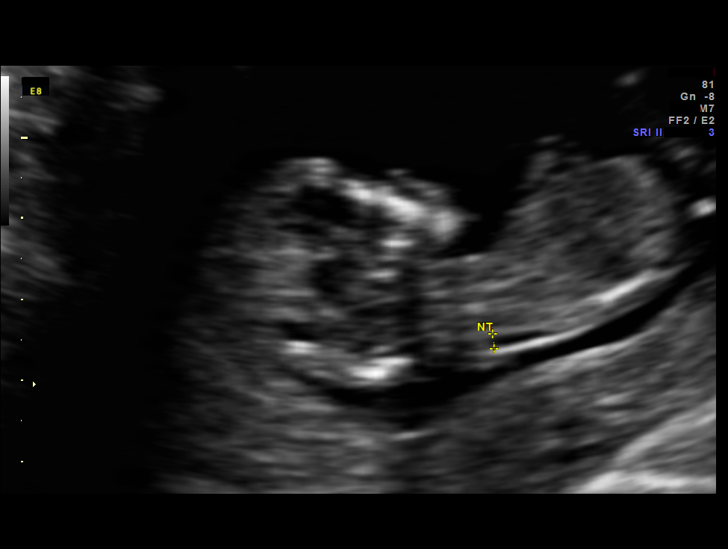
[im 29/36]
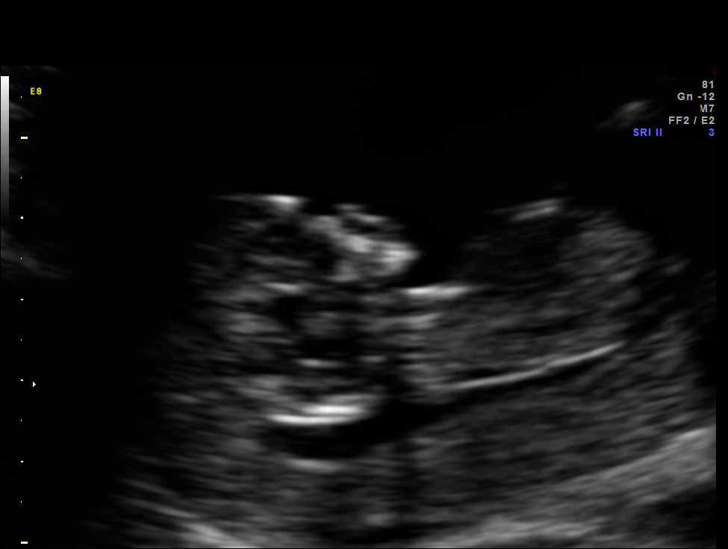
[im 32/36]
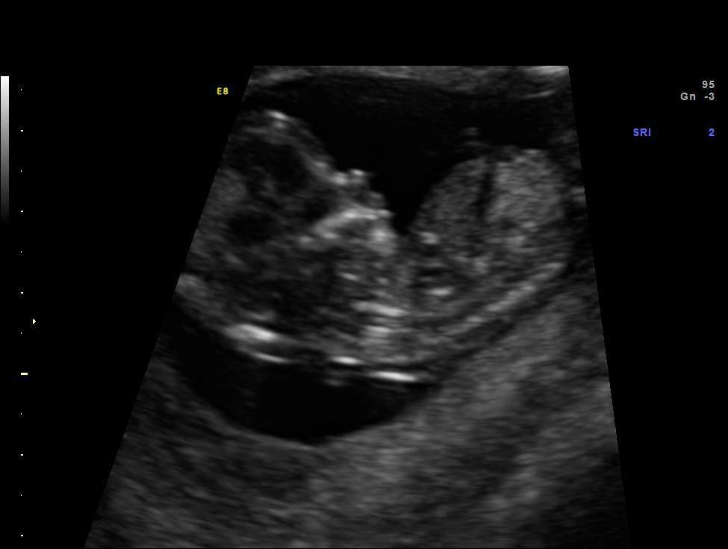
[im 34/36]
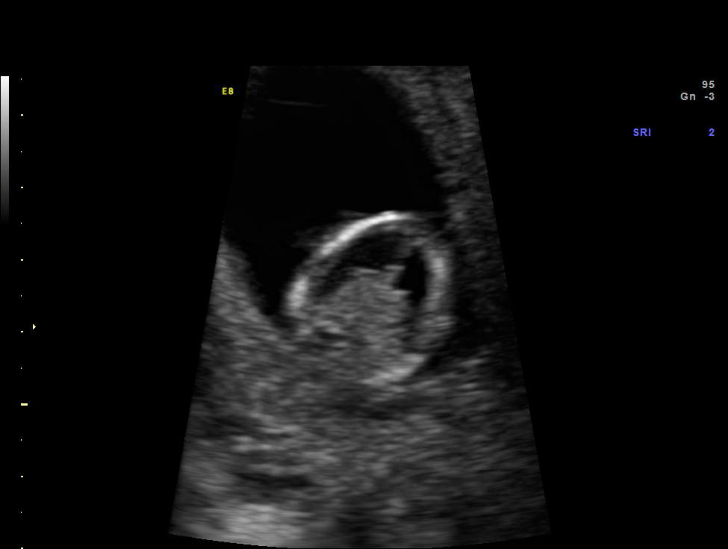

[13 of 28 positions shown; findings below may reference images not displayed]

Faculty Practice
[facility]

TRANSLUCENCY

1  IRINY RM             [id number]      [id number]     [id number]
Indications

First trimester aneuploidy screen (NT)         Z36
12 weeks gestation of pregnancy
OB History

Gravidity:    3         Term:   0        Prem:   0        SAB:   2
TOP:          0       Ectopic:  0        Living: 0
Fetal Evaluation

Num Of Fetuses:     1
Fetal Heart         153
Rate(bpm):
Cardiac Activity:   Observed
Presentation:       Variable
Placenta:           Posterior, above cervical os

Amniotic Fluid
AFI FV:      Subjectively within normal limits
Gestational Age

Best:          12w 5d     Det. By:  Early Ultrasound         EDD:   12/27/15
(06/01/15)
1st Trimester Genetic Sonogram Screening

CRL:            65.5  mm    G. Age:   12w 5d                 EDD:   12/27/15
Nuc Trans:       1.6  mm
Nasal Bone:                 Present
Cervix Uterus Adnexa

Cervix
Normal appearance by transabdominal scan. Appears closed, without
funnelling.

Left Ovary
Within normal limits.

Right Ovary
Within normal limits.
Impression

SIUP at 12+5 weeks
No gross abnormalities identified
NT measurement was within normal limits for this GA; NB
present
Normal amniotic fluid volume
Measurements consistent with early US
Recommendations

Offer MSAFP in the second trimester for ONTD screening
Offer anatomy U/S by 18 weeks

## 2018-07-21 IMAGING — US US MFM FETAL BPP W/O NON-STRESS
1 series · 12 of 18 positions shown · non-contrast
Comparison: none

[Series 1: us mfm fetal bpp w/o non-stress · 18 acquisitions, 12 frames shown]
[im 1/18]
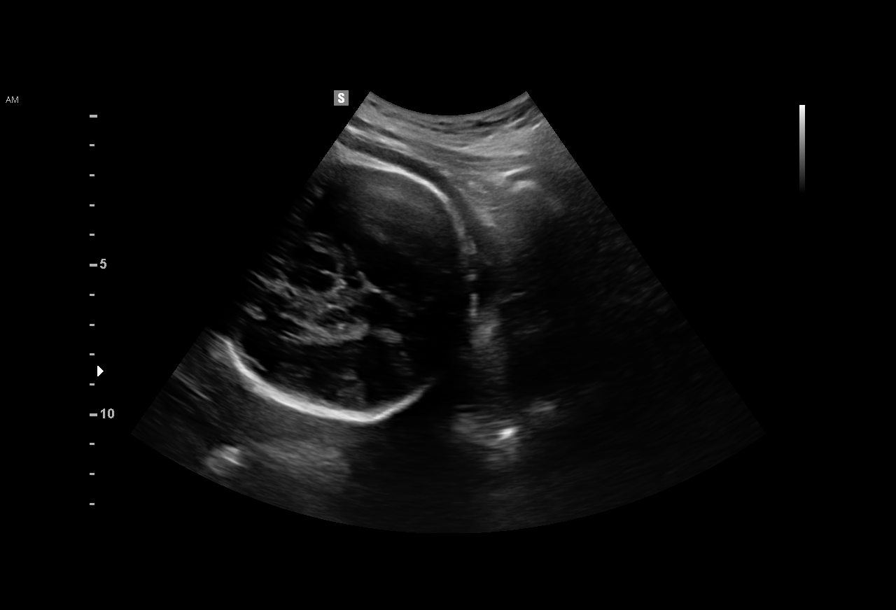
[im 3/18]
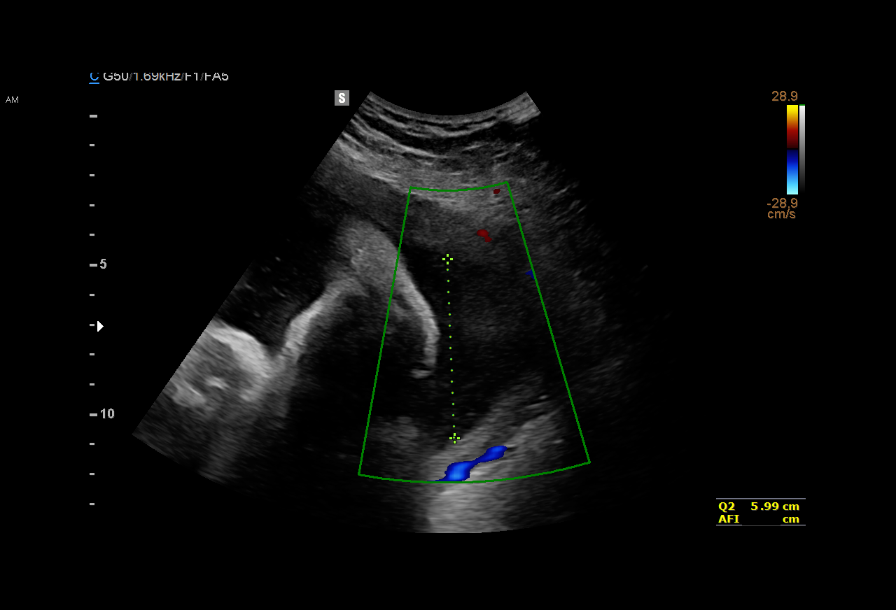
[im 4/18]
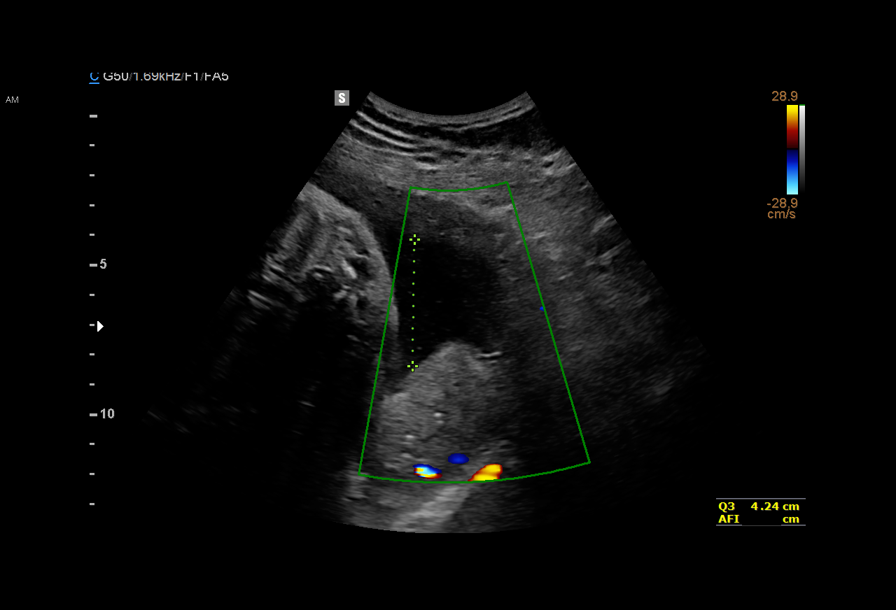
[im 6/18]
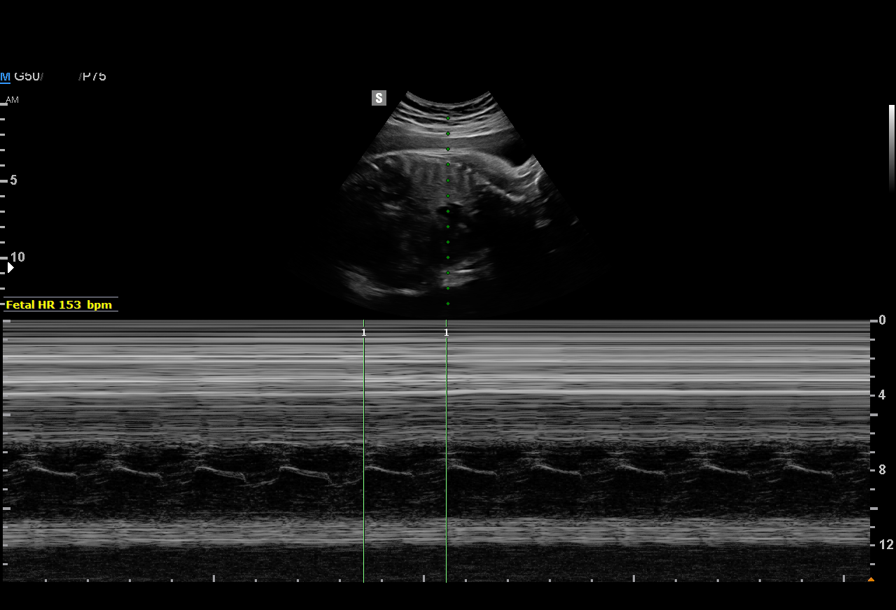
[im 7/18]
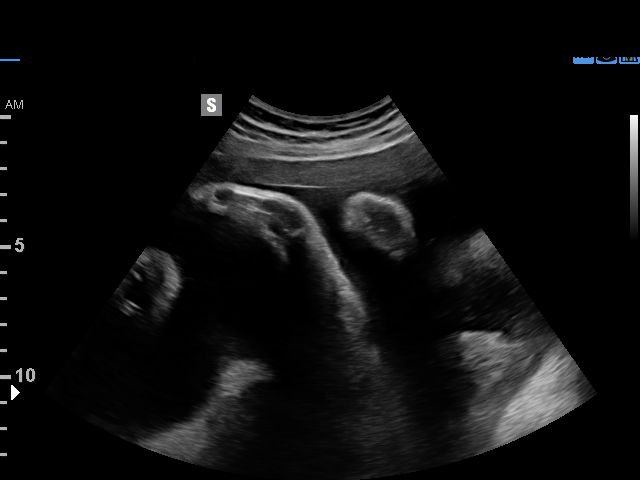
[im 9/18]
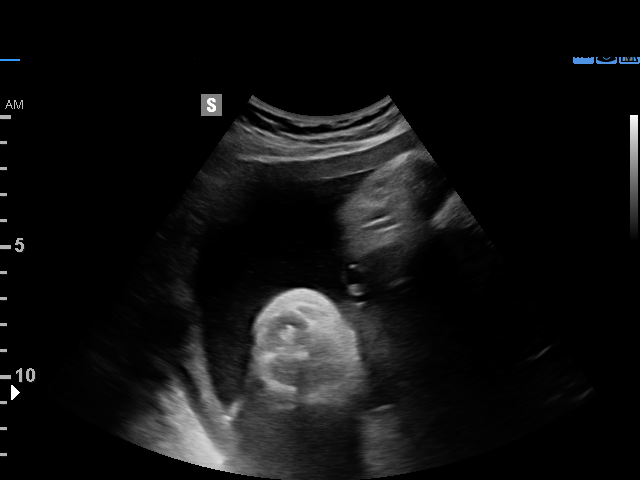
[im 10/18]
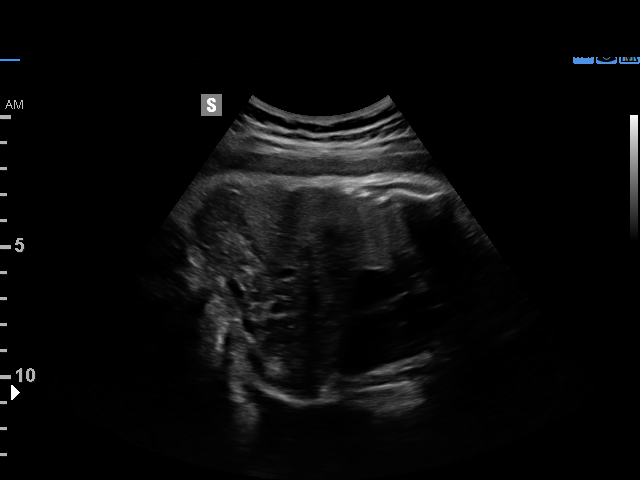
[im 12/18]
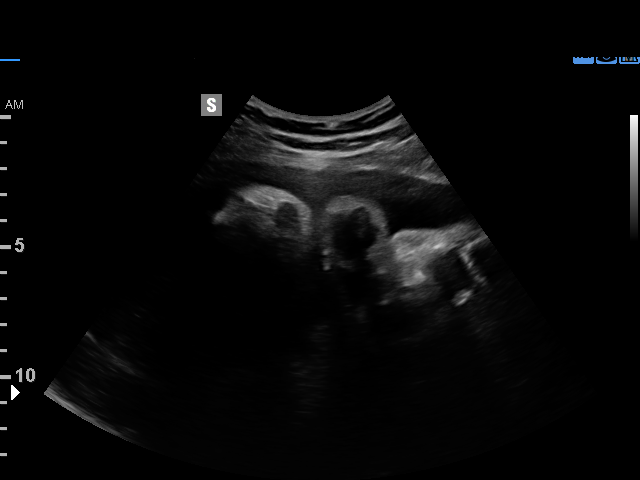
[im 13/18]
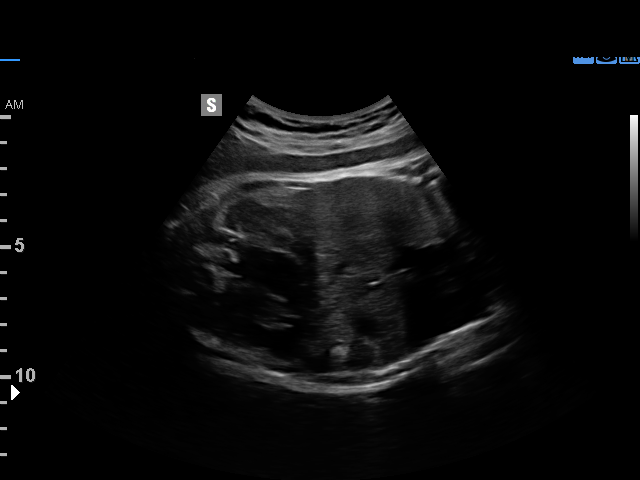
[im 15/18]
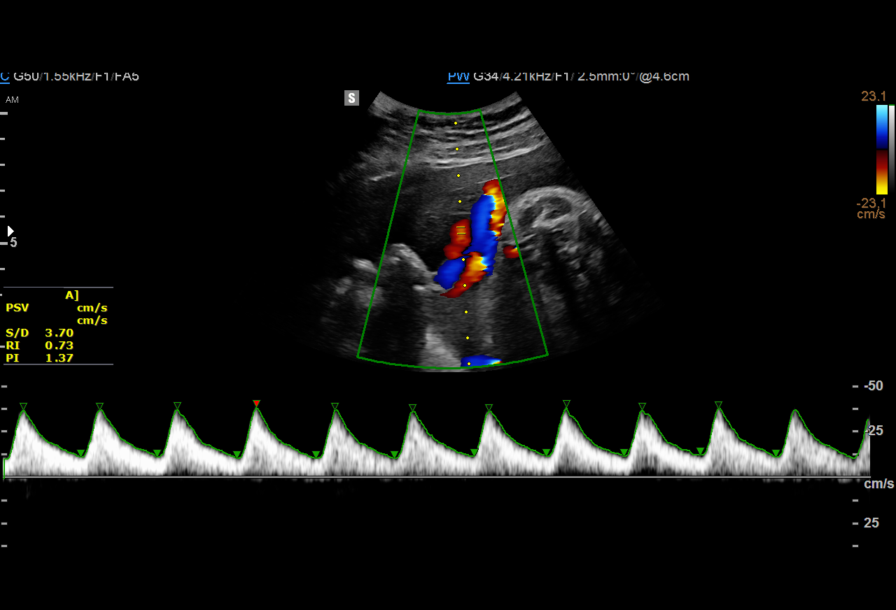
[im 16/18]
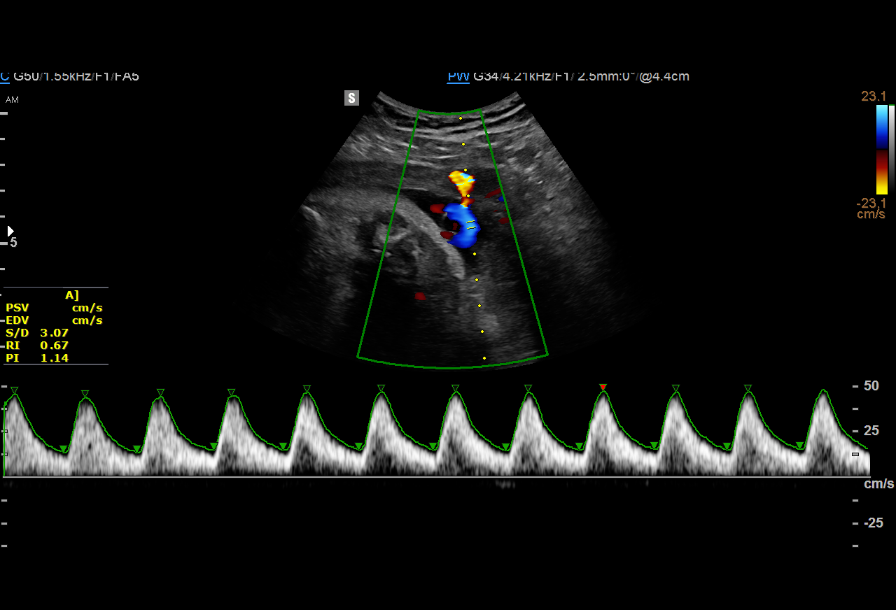
[im 18/18]
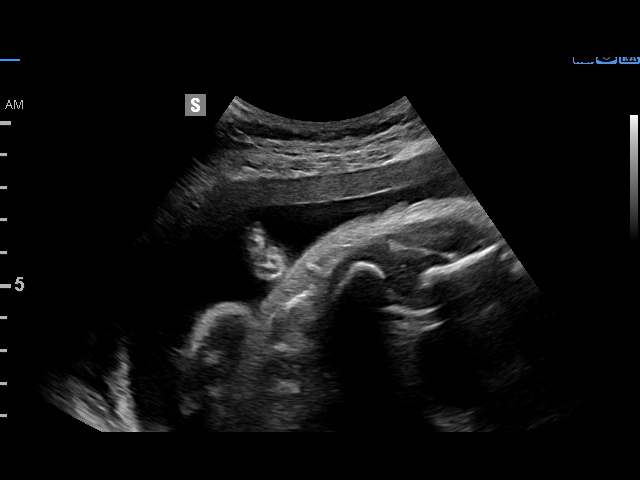

[12 of 18 positions shown; findings below may reference images not displayed]

Name:       HELTA GAHONA                    Visit Date: 12/06/2015 [DATE]

Faculty Practice
[facility]

1  BTISSAM BELAHSEN            [id number]      [phone]     [id number]
2  BTISSAM BELAHSEN            [id number]      [phone]     [id number]
Indications

37 weeks gestation of pregnancy
Gestational diabetes in pregnancy, diet
controlled
Maternal care for known or suspected poor
fetal growth, third trimester, not applicable or
unspecified
OB History

Gravidity:    3         Term:   0        Prem:   0         SAB:   2
TOP:          0       Ectopic:  0        Living: 0
Fetal Evaluation

Num Of Fetuses:     1
Fetal Heart         153
Rate(bpm):
Cardiac Activity:   Observed
Presentation:       Cephalic
Placenta:           Posterior, above cervical os
P. Cord Insertion:  Previously Visualized

Amniotic Fluid
AFI FV:      Subjectively upper-normal

AFI Sum(cm)     %Tile       Largest Pocket(cm)
24.24           94
RUQ(cm)       RLQ(cm)       LUQ(cm)        LLQ(cm)
8.8

Comment:    Breathing noted intermittently, but not sustained.
Biophysical Evaluation

Amniotic F.V:   Pocket => 2 cm two         F. Tone:         Observed
planes
F. Movement:    Observed                   N.S.T:           Reactive
F. Breathing:   Not Observed               Score:           [DATE]
Gestational Age

Best:          37w 0d     Det. By:  Early Ultrasound         EDD:    12/27/15
(06/01/15)
Doppler - Fetal Vessels

Umbilical Artery
S/D     %tile     RI              PI               PSV
(cm/s)
3.4       93

Impression

SIUP at 37+0 weeks
Cephalic presentation
High normal amniotic fluid volume
BPP [DATE] (-2 for BM)
UA dopplers were in the high normal range for this GA
Recommendations

BPP and UA dopplers in one week
Deliver by 39 weeks

## 2018-08-11 ENCOUNTER — Telehealth: Payer: Self-pay | Admitting: Family Medicine

## 2018-08-11 NOTE — Telephone Encounter (Signed)
Pt has not been seen in over a year. Left vm for pt to schedule °

## 2018-08-21 DIAGNOSIS — F411 Generalized anxiety disorder: Secondary | ICD-10-CM | POA: Diagnosis not present

## 2018-08-21 DIAGNOSIS — F3342 Major depressive disorder, recurrent, in full remission: Secondary | ICD-10-CM | POA: Diagnosis not present

## 2018-08-21 DIAGNOSIS — F410 Panic disorder [episodic paroxysmal anxiety] without agoraphobia: Secondary | ICD-10-CM | POA: Diagnosis not present

## 2018-08-21 DIAGNOSIS — F4010 Social phobia, unspecified: Secondary | ICD-10-CM | POA: Diagnosis not present

## 2018-10-14 DIAGNOSIS — F4010 Social phobia, unspecified: Secondary | ICD-10-CM | POA: Diagnosis not present

## 2019-01-05 ENCOUNTER — Encounter: Payer: BLUE CROSS/BLUE SHIELD | Admitting: Family Medicine

## 2019-01-07 ENCOUNTER — Encounter: Payer: Self-pay | Admitting: Adult Health

## 2019-01-07 ENCOUNTER — Ambulatory Visit (INDEPENDENT_AMBULATORY_CARE_PROVIDER_SITE_OTHER): Payer: Self-pay | Admitting: Adult Health

## 2019-01-07 ENCOUNTER — Other Ambulatory Visit: Payer: Self-pay

## 2019-01-07 VITALS — BP 124/70 | HR 80 | Ht 61.0 in | Wt 129.0 lb

## 2019-01-07 DIAGNOSIS — F410 Panic disorder [episodic paroxysmal anxiety] without agoraphobia: Secondary | ICD-10-CM

## 2019-01-07 DIAGNOSIS — F429 Obsessive-compulsive disorder, unspecified: Secondary | ICD-10-CM

## 2019-01-07 DIAGNOSIS — F909 Attention-deficit hyperactivity disorder, unspecified type: Secondary | ICD-10-CM

## 2019-01-07 DIAGNOSIS — F411 Generalized anxiety disorder: Secondary | ICD-10-CM

## 2019-01-07 MED ORDER — AMPHETAMINE-DEXTROAMPHETAMINE 5 MG PO TABS: 5.0000 mg | ORAL_TABLET | Freq: Every day | ORAL | 0 refills | Status: DC

## 2019-01-07 NOTE — Progress Notes (Signed)
[facility] MD/PA/NP Initial Note  01/07/2019 9:12 AM Tia Alertna A Barnhart  MRN:  [id number]   Virtual Visit via Telephone Note  I connected with pt on 01/07/19 at  9:00 AM EDT by telephone and verified that I am speaking with the correct person using two identifiers.   I discussed the limitations, risks, security and privacy concerns of performing an evaluation and management service by telephone and the availability of in person appointments. I also discussed with the patient that there may be a patient responsible charge related to this service. The patient expressed understanding and agreed to proceed.   I discussed the assessment and treatment plan with the patient. The patient was provided an opportunity to ask questions and all were answered. The patient agreed with the plan and demonstrated an understanding of the instructions.   The patient was advised to call back or seek an in-person evaluation if the symptoms worsen or if the condition fails to improve as anticipated.  I provided 45 minutes of non-face-to-face time during this encounter.  The patient was located at home.  [facility]   Chief Complaint:   HPI:   Describes mood today as "ok". Pleasant. Communicative. Mood symptoms - denies depression. Increased anxiety and panic attacks. Irritable at times. Struggles with anxiety "more some days than others". Panic attacks. Feels paralyzed at times. Having increased anxiety and setbacks since Coronavirus. Lost a friend to Breast cancer in August. Having "racing thoughts, shaking, trembling, loss of appetite, diarrhea". Having good and bad days. Tries to talk herself out of her head. Waking up earlier than normal - "a little disruption in my sleep".Varying interest and motivation. Taking medications as prescribed.  Energy levels stable. Active, does not ha e a regular exercise routine. Used to be a "runner". Works full-time as a Customer service managerreal estate agent.   Enjoys some usual interests and activities. Married x 10 years. Spending time with family - husband and 938 year old dad. Parents live local. Sister moving from [place] to [place].  Appetite adequate. Weight stable. Stating "I don't have the best eating habits". Sleeps well most nights. Averages 7 to 8 hours. Focus and concentration difficulties. Feels like she has ADD and always has. Going off on Tangents. Never a good Consulting civil engineerstudent. Has never been formally tested. Took Ritalin in the 4th grade. Completing tasks. Managing aspects of household. Work going well - staying busy. Every day is different. Does better in a structured environment.  Denies SI or HI. Denies AH or VH.  Previous medication trial: Lexapro x 2 years, Prozac - awful, Zoloft 100mg  daily, Xanax 0.5mg  TID, Luvox 25mg  on August 18th and 19th stopped, then tried for 5 more day s, Imipramine 10mg  (not taking), Abilify 2mg  daily, Rexulti was helpful, Adderall 5mg  daily.  Visit Diagnosis: No diagnosis found.  Past Psychiatric History: Denies psychiatric hospitalizations. With current psychiatrist for past 7 years.   Past Medical History:  Past Medical History:  Diagnosis Date  . Anxiety     Past Surgical History:  Procedure Laterality Date  . CESAREAN SECTION N/A 12/18/2015   Procedure: CESAREAN SECTION;  Surgeon: Levie HeritageJacob J Stinson, DO;  [facility];  Service: Obstetrics;  Laterality: N/A;  . NO PAST SURGERIES      Family Psychiatric History: Grandfather - Depression, Father - Depression  Family History:  Family History  Problem Relation Age of Onset  . Thyroid disease Mother   . Heart disease Paternal Grandfather   . Depression Maternal  Grandfather     Social History:  Social History   Socioeconomic History  . Marital status: Married    Spouse name: Not on file  . Number of children: 1  . Years of education: Not on file  . Highest education level: Not on file  Occupational History  . Not on file   Social Needs  . Financial resource strain: Not on file  . Food insecurity    Worry: Not on file    Inability: Not on file  . Transportation needs    Medical: Not on file    Non-medical: Not on file  Tobacco Use  . Smoking status: Current Every Day Smoker    Packs/day: 0.50    Types: Cigarettes  . Smokeless tobacco: Never Used  Substance and Sexual Activity  . Alcohol use: No  . Drug use: No  . Sexual activity: Yes    Birth control/protection: None  Lifestyle  . Physical activity    Days per week: Not on file    Minutes per session: Not on file  . Stress: Not on file  Relationships  . Social Herbalist on phone: Not on file    Gets together: Not on file    Attends religious service: Not on file    Active member of club or organization: Not on file    Attends meetings of clubs or organizations: Not on file    Relationship status: Not on file  Other Topics Concern  . Not on file  Social History Narrative  . Not on file    Allergies:  Allergies  Allergen Reactions  . Penicillins Rash    Has patient had a PCN reaction causing immediate rash, facial/tongue/throat swelling, SOB or lightheadedness with hypotension: Yes Has patient had a PCN reaction causing severe rash involving mucus membranes or skin necrosis: Yes Has patient had a PCN reaction that required hospitalization No Has patient had a PCN reaction occurring within the last 10 years: No If all of the above answers are "NO", then may proceed with Cephalosporin use.     Metabolic Disorder Labs: No results found for: HGBA1C, MPG No results found for: PROLACTIN Lab Results  Component Value Date   CHOL 181 07/02/2017   TRIG 103.0 07/02/2017   HDL 48.80 07/02/2017   CHOLHDL 4 07/02/2017   VLDL 20.6 07/02/2017   LDLCALC 111 (H) 07/02/2017   Lab Results  Component Value Date   TSH 1.20 07/02/2017    Therapeutic Level Labs: No results found for: LITHIUM No results found for: VALPROATE No  components found for:  CBMZ  Current Medications: Current Outpatient Medications  Medication Sig Dispense Refill  . sertraline (ZOLOFT) 100 MG tablet Take 200 mg by mouth at bedtime.      No current facility-administered medications for this visit.     Medication Side Effects: none  Orders placed this visit:  No orders of the defined types were placed in this encounter.   Psychiatric Specialty Exam:  ROS  unknown if currently breastfeeding.There is no height or weight on file to calculate BMI.  General Appearance: UTA  Eye Contact:  UTA  Speech:  Normal Rate  Volume:  Normal  Mood:  Anxious  Affect:  Appropriate  Thought Process:  Coherent  Orientation:  Full (Time, Place, and Person)  Thought Content: Logical   Suicidal Thoughts:  No  Homicidal Thoughts:  No  Memory:  WNL  Judgement:  Good  Insight:  Good  Psychomotor Activity:  Normal  Concentration:  Concentration: Good  Recall:  Good  Fund of Knowledge: Good  Language: Good  Assets:  Communication Skills Desire for Improvement Financial Resources/Insurance Housing Intimacy Leisure Time Physical Health Resilience Social Support Talents/Skills Transportation Vocational/Educational  ADL's:  Intact  Cognition: WNL  Prognosis:  Good   Screenings:  GAD-7     Postpartum Visit from 01/29/2016 in Center for [facility] Routine Prenatal from 12/11/2015 in Center for [facility] Routine Prenatal from 12/04/2015 in Center for [facility]  Total GAD-7 Score  2  1  0    PHQ2-9     Office Visit from 06/27/2017 in [facility] Postpartum Visit from 01/29/2016 in [facility] for [facility] Routine Prenatal from 12/11/2015 in [facility] Routine Prenatal from 12/04/2015 in Center for [facility] FOLLOW UP from 06/19/2015 in [facility] OUTPATIENT ULTRASOUND  PHQ-2 Total Score  0   0  0  0  0  PHQ-9 Total Score  -  0  1  2  -      Receiving Psychotherapy: No   Treatment Plan/Recommendations:    Plan:  1. Zoloft 200mg  daily - none needed this visit 2. Xanax 0.5mg  TID - none needed this visit - undertakes  3. Add Adderall 5mg  daily - no substance history.  Consider Buspar   RTC 4 weeks  Patient advised to contact office with any questions, adverse effects, or acute worsening in signs and symptoms.  Dorothyann Gibbs, NP

## 2019-01-11 ENCOUNTER — Telehealth: Payer: Self-pay

## 2019-01-11 NOTE — Telephone Encounter (Signed)
Spoke with pt. She wanted to give an update on her medications. She is still feeling anxious. Feels like she is close to having anxiety attacks and has had to take Xanax yesterday and today for it.  She states that she took the Adderall and it was awful. She had a headache, blah feeling, spaced out. She stopped taking it after one day. She took the 5 Mg of it as instructed. She would also like the know the beneficial differences between Klonopin and Xanax? Please advise.

## 2019-01-11 NOTE — Telephone Encounter (Signed)
The clonazepam lasts longer. Would she like to try another medication?. Could also add Buspar for anxiety - non-controlled.

## 2019-01-12 NOTE — Telephone Encounter (Signed)
Spoke with Hitchcock and she would like to try the Buspar to help with her anxiety along with the Xanax. [facility] [place] on [address]. Advised pt. To give [place] a call if she has any questions or concerns.

## 2019-01-13 ENCOUNTER — Telehealth: Payer: Self-pay

## 2019-01-13 MED ORDER — BUSPIRONE HCL 5 MG PO TABS: 5.0000 mg | ORAL_TABLET | Freq: Two times a day (BID) | ORAL | 0 refills | Status: DC

## 2019-01-13 NOTE — Telephone Encounter (Signed)
Rx sent it by Traci. Pt. Will pick up today.

## 2019-01-13 NOTE — Telephone Encounter (Signed)
Rx for buspar 5 mg bid #[phone] [facility] to [facility] per request

## 2019-02-05 ENCOUNTER — Other Ambulatory Visit: Payer: Self-pay | Admitting: Adult Health

## 2019-04-02 ENCOUNTER — Other Ambulatory Visit: Payer: Self-pay

## 2019-04-02 DIAGNOSIS — Z20822 Contact with and (suspected) exposure to covid-19: Secondary | ICD-10-CM

## 2019-04-04 LAB — NOVEL CORONAVIRUS, NAA: SARS-CoV-2, NAA: NOT DETECTED

## 2019-06-23 ENCOUNTER — Encounter: Payer: Self-pay | Admitting: Family Medicine

## 2019-07-15 LAB — OB RESULTS CONSOLE ANTIBODY SCREEN: Antibody Screen: NEGATIVE

## 2019-07-15 LAB — OB RESULTS CONSOLE HIV ANTIBODY (ROUTINE TESTING): HIV: NONREACTIVE

## 2019-07-15 LAB — OB RESULTS CONSOLE HEPATITIS B SURFACE ANTIGEN: Hepatitis B Surface Ag: NEGATIVE

## 2019-07-15 LAB — OB RESULTS CONSOLE RUBELLA ANTIBODY, IGM: Rubella: IMMUNE

## 2019-07-15 LAB — OB RESULTS CONSOLE ABO/RH: RH Type: POSITIVE

## 2019-07-15 LAB — OB RESULTS CONSOLE RPR: RPR: NONREACTIVE

## 2019-07-15 LAB — OB RESULTS CONSOLE GC/CHLAMYDIA
Chlamydia: NEGATIVE
Gonorrhea: NEGATIVE

## 2019-07-16 ENCOUNTER — Other Ambulatory Visit: Payer: Self-pay | Admitting: Obstetrics and Gynecology

## 2019-07-22 ENCOUNTER — Encounter: Payer: Self-pay | Admitting: Family Medicine

## 2019-12-20 ENCOUNTER — Other Ambulatory Visit: Payer: Self-pay | Admitting: Obstetrics and Gynecology

## 2020-01-04 ENCOUNTER — Encounter (HOSPITAL_COMMUNITY): Payer: Self-pay | Admitting: *Deleted

## 2020-01-04 ENCOUNTER — Encounter (HOSPITAL_COMMUNITY): Payer: Self-pay

## 2020-01-04 NOTE — Patient Instructions (Signed)
Madison Bowman  01/04/2020   Your procedure is scheduled on:  01/08/2020  Arrive at 0900 at Entrance C on [address] at [facility]  and [address]. You are invited to use the FREE valet parking or use the Visitor's parking deck.  Pick up the phone at the desk and dial [phone].  Call this number if you have problems the morning of surgery: [phone]  Remember:   Do not eat food:(After Midnight) Desps de medianoche.  Do not drink clear liquids: (After Midnight) Desps de medianoche.  Take these medicines the morning of surgery with A SIP OF WATER:  none   Do not wear jewelry, make-up or nail polish.  Do not wear lotions, powders, or perfumes. Do not wear deodorant.  Do not shave 48 hours prior to surgery.  Do not bring valuables to the hospital.  [facility] is not   responsible for any belongings or valuables brought to the hospital.  Contacts, dentures or bridgework may not be worn into surgery.  Leave suitcase in the car. After surgery it may be brought to your room.  For patients admitted to the hospital, checkout time is 11:00 AM the day of              discharge.      Please read over the following fact sheets that you were given:     Preparing for Surgery

## 2020-01-06 ENCOUNTER — Other Ambulatory Visit: Payer: Self-pay

## 2020-01-06 ENCOUNTER — Other Ambulatory Visit (HOSPITAL_COMMUNITY)
Admission: RE | Admit: 2020-01-06 | Discharge: 2020-01-06 | Disposition: A | Discharge status: Home / Self Care | Payer: Medicaid Other | Source: Ambulatory Visit | Attending: Obstetrics & Gynecology | Admitting: Obstetrics & Gynecology

## 2020-01-06 DIAGNOSIS — Z20822 Contact with and (suspected) exposure to covid-19: Secondary | ICD-10-CM | POA: Diagnosis not present

## 2020-01-06 DIAGNOSIS — Z01812 Encounter for preprocedural laboratory examination: Secondary | ICD-10-CM | POA: Insufficient documentation

## 2020-01-06 HISTORY — DX: Unspecified abnormal cytological findings in specimens from vagina: R87.629

## 2020-01-06 LAB — TYPE AND SCREEN
ABO/RH(D): A POS
Antibody Screen: NEGATIVE

## 2020-01-06 LAB — RPR: RPR Ser Ql: NONREACTIVE

## 2020-01-06 LAB — SARS CORONAVIRUS 2 (TAT 6-24 HRS): SARS Coronavirus 2: NEGATIVE

## 2020-01-06 LAB — CBC
HCT: 34 % — ABNORMAL LOW (ref 36.0–46.0)
Hemoglobin: 11.2 g/dL — ABNORMAL LOW (ref 12.0–15.0)
MCH: 27.7 pg (ref 26.0–34.0)
MCHC: 32.9 g/dL (ref 30.0–36.0)
MCV: 84 fL (ref 80.0–100.0)
Platelets: 283 10*3/uL (ref 150–400)
RBC: 4.05 MIL/uL (ref 3.87–5.11)
RDW: 13.5 % (ref 11.5–15.5)
WBC: 13.2 10*3/uL — ABNORMAL HIGH (ref 4.0–10.5)
nRBC: 0 % (ref 0.0–0.2)

## 2020-01-06 NOTE — Progress Notes (Signed)
Presents for Covid-19 swab & pro-op blood work.  Denies Covid symptoms, swab completed.  Pre-op instructions and body wash given.

## 2020-01-08 ENCOUNTER — Inpatient Hospital Stay (HOSPITAL_COMMUNITY): Payer: Medicaid Other | Admitting: Anesthesiology

## 2020-01-08 ENCOUNTER — Inpatient Hospital Stay (HOSPITAL_COMMUNITY)
Admission: RE | Admit: 2020-01-08 | Discharge: 2020-01-10 | DRG: 787 | Disposition: A | Discharge status: Home / Self Care | Payer: Medicaid Other | Attending: Obstetrics and Gynecology | Admitting: Obstetrics and Gynecology

## 2020-01-08 ENCOUNTER — Other Ambulatory Visit: Payer: Self-pay

## 2020-01-08 ENCOUNTER — Encounter (HOSPITAL_COMMUNITY): Payer: Self-pay | Admitting: Obstetrics and Gynecology

## 2020-01-08 ENCOUNTER — Encounter (HOSPITAL_COMMUNITY)
Admission: RE | Disposition: A | Discharge status: Home / Self Care | Payer: Self-pay | Source: Home / Self Care | Attending: Obstetrics and Gynecology

## 2020-01-08 DIAGNOSIS — O34219 Maternal care for unspecified type scar from previous cesarean delivery: Secondary | ICD-10-CM | POA: Diagnosis present

## 2020-01-08 DIAGNOSIS — Z3A39 39 weeks gestation of pregnancy: Secondary | ICD-10-CM

## 2020-01-08 DIAGNOSIS — F17210 Nicotine dependence, cigarettes, uncomplicated: Secondary | ICD-10-CM | POA: Diagnosis present

## 2020-01-08 DIAGNOSIS — O34211 Maternal care for low transverse scar from previous cesarean delivery: Secondary | ICD-10-CM | POA: Diagnosis present

## 2020-01-08 DIAGNOSIS — F411 Generalized anxiety disorder: Secondary | ICD-10-CM | POA: Diagnosis present

## 2020-01-08 DIAGNOSIS — O99334 Smoking (tobacco) complicating childbirth: Secondary | ICD-10-CM | POA: Diagnosis present

## 2020-01-08 DIAGNOSIS — O165 Unspecified maternal hypertension, complicating the puerperium: Secondary | ICD-10-CM | POA: Diagnosis not present

## 2020-01-08 DIAGNOSIS — D62 Acute posthemorrhagic anemia: Secondary | ICD-10-CM | POA: Diagnosis not present

## 2020-01-08 DIAGNOSIS — Z79899 Other long term (current) drug therapy: Secondary | ICD-10-CM

## 2020-01-08 DIAGNOSIS — O9081 Anemia of the puerperium: Secondary | ICD-10-CM | POA: Diagnosis not present

## 2020-01-08 DIAGNOSIS — Z20822 Contact with and (suspected) exposure to covid-19: Secondary | ICD-10-CM | POA: Diagnosis present

## 2020-01-08 DIAGNOSIS — O99344 Other mental disorders complicating childbirth: Secondary | ICD-10-CM | POA: Diagnosis present

## 2020-01-08 DIAGNOSIS — F17200 Nicotine dependence, unspecified, uncomplicated: Secondary | ICD-10-CM | POA: Diagnosis present

## 2020-01-08 DIAGNOSIS — Z98891 History of uterine scar from previous surgery: Secondary | ICD-10-CM

## 2020-01-08 DIAGNOSIS — Z880 Allergy status to penicillin: Secondary | ICD-10-CM

## 2020-01-08 SURGERY — Surgical Case
Anesthesia: Spinal

## 2020-01-08 MED ORDER — ZOLPIDEM TARTRATE 5 MG PO TABS: 5.0000 mg | ORAL_TABLET | Freq: Every evening | ORAL | Status: DC | PRN

## 2020-01-08 MED ORDER — MORPHINE SULFATE (PF) 0.5 MG/ML IJ SOLN
INTRAMUSCULAR | Status: AC
  Filled 2020-01-08: qty 10

## 2020-01-08 MED ORDER — OXYCODONE HCL 5 MG PO TABS: 5.0000 mg | ORAL_TABLET | Freq: Once | ORAL | Status: DC | PRN

## 2020-01-08 MED ORDER — BUPIVACAINE HCL (PF) 0.25 % IJ SOLN
INTRAMUSCULAR | Status: AC
  Filled 2020-01-08: qty 10

## 2020-01-08 MED ORDER — PHENYLEPHRINE HCL-NACL 20-0.9 MG/250ML-% IV SOLN
INTRAVENOUS | Status: AC
  Filled 2020-01-08: qty 250

## 2020-01-08 MED ORDER — OXYCODONE HCL 5 MG/5ML PO SOLN: 5.0000 mg | Freq: Once | ORAL | Status: DC | PRN

## 2020-01-08 MED ORDER — SOD CITRATE-CITRIC ACID 500-334 MG/5ML PO SOLN
30.0000 mL | Freq: Once | ORAL | Status: AC
  Administered 2020-01-08: 30 mL via ORAL

## 2020-01-08 MED ORDER — KETOROLAC TROMETHAMINE 30 MG/ML IJ SOLN
30.0000 mg | Freq: Four times a day (QID) | INTRAMUSCULAR | Status: AC
  Administered 2020-01-08 – 2020-01-09 (×3): 30 mg via INTRAVENOUS
  Filled 2020-01-08 (×3): qty 1

## 2020-01-08 MED ORDER — OXYTOCIN-SODIUM CHLORIDE 30-0.9 UT/500ML-% IV SOLN: 2.5000 [IU]/h | INTRAVENOUS | Status: AC

## 2020-01-08 MED ORDER — BUPIVACAINE HCL (PF) 0.25 % IJ SOLN
INTRAMUSCULAR | Status: DC | PRN
  Administered 2020-01-08 (×2): 10 mL

## 2020-01-08 MED ORDER — FAMOTIDINE 20 MG PO TABS
20.0000 mg | ORAL_TABLET | Freq: Once | ORAL | Status: AC
  Administered 2020-01-08: 20 mg via ORAL

## 2020-01-08 MED ORDER — CHLORHEXIDINE GLUCONATE 0.12 % MT SOLN
15.0000 mL | Freq: Once | OROMUCOSAL | Status: AC
  Administered 2020-01-08: 15 mL via OROMUCOSAL

## 2020-01-08 MED ORDER — CEFAZOLIN SODIUM-DEXTROSE 2-4 GM/100ML-% IV SOLN: 2.0000 g | INTRAVENOUS | Status: DC

## 2020-01-08 MED ORDER — SOD CITRATE-CITRIC ACID 500-334 MG/5ML PO SOLN
ORAL | Status: AC
  Filled 2020-01-08: qty 30

## 2020-01-08 MED ORDER — KETOROLAC TROMETHAMINE 30 MG/ML IJ SOLN
30.0000 mg | Freq: Four times a day (QID) | INTRAMUSCULAR | Status: AC | PRN
  Administered 2020-01-08: 30 mg via INTRAMUSCULAR

## 2020-01-08 MED ORDER — KETOROLAC TROMETHAMINE 30 MG/ML IJ SOLN: 30.0000 mg | Freq: Four times a day (QID) | INTRAMUSCULAR | Status: AC | PRN

## 2020-01-08 MED ORDER — ONDANSETRON HCL 4 MG/2ML IJ SOLN
INTRAMUSCULAR | Status: AC
  Filled 2020-01-08: qty 2

## 2020-01-08 MED ORDER — CALCIUM CARBONATE ANTACID 500 MG PO CHEW
400.0000 mg | CHEWABLE_TABLET | Freq: Once | ORAL | Status: AC
  Administered 2020-01-08: 400 mg via ORAL
  Filled 2020-01-08: qty 2

## 2020-01-08 MED ORDER — COCONUT OIL OIL
1.0000 "application " | TOPICAL_OIL | Status: DC | PRN
  Administered 2020-01-09: 1 via TOPICAL

## 2020-01-08 MED ORDER — FENTANYL CITRATE (PF) 100 MCG/2ML IJ SOLN: 25.0000 ug | INTRAMUSCULAR | Status: DC | PRN

## 2020-01-08 MED ORDER — DIPHENHYDRAMINE HCL 25 MG PO CAPS: 25.0000 mg | ORAL_CAPSULE | Freq: Four times a day (QID) | ORAL | Status: DC | PRN

## 2020-01-08 MED ORDER — SCOPOLAMINE 1 MG/3DAYS TD PT72
MEDICATED_PATCH | TRANSDERMAL | Status: AC
  Filled 2020-01-08: qty 1

## 2020-01-08 MED ORDER — ONDANSETRON HCL 4 MG/2ML IJ SOLN
INTRAMUSCULAR | Status: DC | PRN
  Administered 2020-01-08: 4 mg via INTRAVENOUS

## 2020-01-08 MED ORDER — LACTATED RINGERS IV SOLN: INTRAVENOUS | Status: DC

## 2020-01-08 MED ORDER — SIMETHICONE 80 MG PO CHEW
80.0000 mg | CHEWABLE_TABLET | ORAL | Status: DC
  Administered 2020-01-08 – 2020-01-09 (×2): 80 mg via ORAL
  Filled 2020-01-08 (×2): qty 1

## 2020-01-08 MED ORDER — METHYLERGONOVINE MALEATE 0.2 MG PO TABS: 0.2000 mg | ORAL_TABLET | ORAL | Status: DC | PRN

## 2020-01-08 MED ORDER — PROMETHAZINE HCL 25 MG/ML IJ SOLN: 6.2500 mg | INTRAMUSCULAR | Status: DC | PRN

## 2020-01-08 MED ORDER — IBUPROFEN 800 MG PO TABS
800.0000 mg | ORAL_TABLET | Freq: Four times a day (QID) | ORAL | Status: DC
  Administered 2020-01-09 – 2020-01-10 (×5): 800 mg via ORAL
  Filled 2020-01-08 (×6): qty 1

## 2020-01-08 MED ORDER — MENTHOL 3 MG MT LOZG: 1.0000 | LOZENGE | OROMUCOSAL | Status: DC | PRN

## 2020-01-08 MED ORDER — CHLORHEXIDINE GLUCONATE 0.12 % MT SOLN
OROMUCOSAL | Status: AC
  Filled 2020-01-08: qty 15

## 2020-01-08 MED ORDER — SIMETHICONE 80 MG PO CHEW: 80.0000 mg | CHEWABLE_TABLET | ORAL | Status: DC | PRN

## 2020-01-08 MED ORDER — MORPHINE SULFATE (PF) 0.5 MG/ML IJ SOLN
INTRAMUSCULAR | Status: DC | PRN
Start: 2020-01-08 — End: 2020-01-08
  Administered 2020-01-08: 150 ug via INTRATHECAL

## 2020-01-08 MED ORDER — OXYTOCIN-SODIUM CHLORIDE 30-0.9 UT/500ML-% IV SOLN
INTRAVENOUS | Status: AC
  Filled 2020-01-08: qty 500

## 2020-01-08 MED ORDER — BUPIVACAINE IN DEXTROSE 0.75-8.25 % IT SOLN
INTRATHECAL | Status: DC | PRN
  Administered 2020-01-08: 1.6 mL via INTRATHECAL

## 2020-01-08 MED ORDER — MEPERIDINE HCL 25 MG/ML IJ SOLN: 6.2500 mg | INTRAMUSCULAR | Status: DC | PRN

## 2020-01-08 MED ORDER — WITCH HAZEL-GLYCERIN EX PADS: 1.0000 "application " | MEDICATED_PAD | CUTANEOUS | Status: DC | PRN

## 2020-01-08 MED ORDER — SENNOSIDES-DOCUSATE SODIUM 8.6-50 MG PO TABS
2.0000 | ORAL_TABLET | ORAL | Status: DC
  Administered 2020-01-08 – 2020-01-09 (×2): 2 via ORAL
  Filled 2020-01-08 (×2): qty 2

## 2020-01-08 MED ORDER — DIBUCAINE (PERIANAL) 1 % EX OINT: 1.0000 "application " | TOPICAL_OINTMENT | CUTANEOUS | Status: DC | PRN

## 2020-01-08 MED ORDER — CEFAZOLIN SODIUM-DEXTROSE 2-4 GM/100ML-% IV SOLN
INTRAVENOUS | Status: AC
  Filled 2020-01-08: qty 100

## 2020-01-08 MED ORDER — SIMETHICONE 80 MG PO CHEW
80.0000 mg | CHEWABLE_TABLET | Freq: Three times a day (TID) | ORAL | Status: DC
  Administered 2020-01-08 – 2020-01-10 (×6): 80 mg via ORAL
  Filled 2020-01-08 (×7): qty 1

## 2020-01-08 MED ORDER — FENTANYL CITRATE (PF) 100 MCG/2ML IJ SOLN
INTRAMUSCULAR | Status: AC
  Filled 2020-01-08: qty 2

## 2020-01-08 MED ORDER — PRENATAL MULTIVITAMIN CH
1.0000 | ORAL_TABLET | Freq: Every day | ORAL | Status: DC
  Filled 2020-01-08 (×3): qty 1

## 2020-01-08 MED ORDER — ORAL CARE MOUTH RINSE: 15.0000 mL | Freq: Once | OROMUCOSAL | Status: AC

## 2020-01-08 MED ORDER — SODIUM CHLORIDE 0.9 % IR SOLN
Status: DC | PRN
  Administered 2020-01-08: 1

## 2020-01-08 MED ORDER — FAMOTIDINE 20 MG PO TABS
ORAL_TABLET | ORAL | Status: AC
  Filled 2020-01-08: qty 1

## 2020-01-08 MED ORDER — CEFAZOLIN SODIUM-DEXTROSE 2-3 GM-%(50ML) IV SOLR
INTRAVENOUS | Status: DC | PRN
  Administered 2020-01-08: 2 g via INTRAVENOUS

## 2020-01-08 MED ORDER — OXYTOCIN-SODIUM CHLORIDE 30-0.9 UT/500ML-% IV SOLN
INTRAVENOUS | Status: DC | PRN
  Administered 2020-01-08: 41 m[IU]/min via INTRAVENOUS

## 2020-01-08 MED ORDER — KETOROLAC TROMETHAMINE 30 MG/ML IJ SOLN
INTRAMUSCULAR | Status: AC
  Filled 2020-01-08: qty 1

## 2020-01-08 MED ORDER — FENTANYL CITRATE (PF) 100 MCG/2ML IJ SOLN
INTRAMUSCULAR | Status: DC | PRN
Start: 2020-01-08 — End: 2020-01-08
  Administered 2020-01-08: 15 ug via INTRATHECAL

## 2020-01-08 MED ORDER — SCOPOLAMINE 1 MG/3DAYS TD PT72
1.0000 | MEDICATED_PATCH | Freq: Once | TRANSDERMAL | Status: DC
  Administered 2020-01-08: 1.5 mg via TRANSDERMAL

## 2020-01-08 MED ORDER — PHENYLEPHRINE HCL-NACL 20-0.9 MG/250ML-% IV SOLN
INTRAVENOUS | Status: DC | PRN
  Administered 2020-01-08: 60 ug/min via INTRAVENOUS

## 2020-01-08 MED ORDER — TETANUS-DIPHTH-ACELL PERTUSSIS 5-2.5-18.5 LF-MCG/0.5 IM SUSP: 0.5000 mL | Freq: Once | INTRAMUSCULAR | Status: DC

## 2020-01-08 MED ORDER — OXYCODONE-ACETAMINOPHEN 5-325 MG PO TABS: 1.0000 | ORAL_TABLET | ORAL | Status: DC | PRN

## 2020-01-08 MED ORDER — METHYLERGONOVINE MALEATE 0.2 MG/ML IJ SOLN: 0.2000 mg | INTRAMUSCULAR | Status: DC | PRN

## 2020-01-08 MED ORDER — POVIDONE-IODINE 10 % EX SWAB
2.0000 "application " | Freq: Once | CUTANEOUS | Status: AC
  Administered 2020-01-08: 2 via TOPICAL

## 2020-01-08 MED ORDER — SERTRALINE HCL 50 MG PO TABS
200.0000 mg | ORAL_TABLET | Freq: Every day | ORAL | Status: DC
  Administered 2020-01-08 – 2020-01-09 (×2): 200 mg via ORAL
  Filled 2020-01-08 (×2): qty 4

## 2020-01-08 SURGICAL SUPPLY — 38 items
APL SKNCLS STERI-STRIP NONHPOA (GAUZE/BANDAGES/DRESSINGS) ×1
BENZOIN TINCTURE PRP APPL 2/3 (GAUZE/BANDAGES/DRESSINGS) ×1 IMPLANT
CHLORAPREP W/TINT 26ML (MISCELLANEOUS) ×2 IMPLANT
CLAMP CORD UMBIL (MISCELLANEOUS) IMPLANT
CLOSURE STERI STRIP 1/2 X4 (GAUZE/BANDAGES/DRESSINGS) ×1 IMPLANT
CLOTH BEACON ORANGE TIMEOUT ST (SAFETY) ×2 IMPLANT
DRSG OPSITE POSTOP 4X10 (GAUZE/BANDAGES/DRESSINGS) ×2 IMPLANT
ELECT REM PT RETURN 9FT ADLT (ELECTROSURGICAL) ×2
ELECTRODE REM PT RTRN 9FT ADLT (ELECTROSURGICAL) ×1 IMPLANT
EXTRACTOR VACUUM M CUP 4 TUBE (SUCTIONS) IMPLANT
GLOVE BIO SURGEON STRL SZ7.5 (GLOVE) ×2 IMPLANT
GLOVE BIOGEL PI IND STRL 7.0 (GLOVE) ×1 IMPLANT
GLOVE BIOGEL PI INDICATOR 7.0 (GLOVE) ×1
GOWN STRL REUS W/TWL LRG LVL3 (GOWN DISPOSABLE) ×4 IMPLANT
KIT ABG SYR 3ML LUER SLIP (SYRINGE) IMPLANT
NDL HYPO 25X5/8 SAFETYGLIDE (NEEDLE) IMPLANT
NDL SPNL 20GX3.5 QUINCKE YW (NEEDLE) IMPLANT
NEEDLE HYPO 22GX1.5 SAFETY (NEEDLE) ×2 IMPLANT
NEEDLE HYPO 25X5/8 SAFETYGLIDE (NEEDLE) IMPLANT
NEEDLE SPNL 20GX3.5 QUINCKE YW (NEEDLE) IMPLANT
NS IRRIG 1000ML POUR BTL (IV SOLUTION) ×2 IMPLANT
PACK C SECTION WH (CUSTOM PROCEDURE TRAY) ×2 IMPLANT
PENCIL SMOKE EVAC W/HOLSTER (ELECTROSURGICAL) ×2 IMPLANT
SUT MNCRL 0 VIOLET CTX 36 (SUTURE) ×2 IMPLANT
SUT MNCRL AB 3-0 PS2 27 (SUTURE) IMPLANT
SUT MON AB 2-0 CT1 27 (SUTURE) ×2 IMPLANT
SUT MON AB-0 CT1 36 (SUTURE) ×4 IMPLANT
SUT MONOCRYL 0 CTX 36 (SUTURE) ×6
SUT PLAIN 0 NONE (SUTURE) IMPLANT
SUT PLAIN 2 0 (SUTURE)
SUT PLAIN 2 0 XLH (SUTURE) IMPLANT
SUT PLAIN ABS 2-0 CT1 27XMFL (SUTURE) IMPLANT
SUT VIC AB 4-0 KS 27 (SUTURE) ×1 IMPLANT
SYR 20CC LL (SYRINGE) IMPLANT
SYR CONTROL 10ML LL (SYRINGE) ×2 IMPLANT
TOWEL OR 17X24 6PK STRL BLUE (TOWEL DISPOSABLE) ×2 IMPLANT
TRAY FOLEY W/BAG SLVR 14FR LF (SET/KITS/TRAYS/PACK) ×2 IMPLANT
WATER STERILE IRR 1000ML POUR (IV SOLUTION) ×2 IMPLANT

## 2020-01-08 NOTE — Op Note (Signed)
Cesarean Section Procedure Note  Indications: previous uterine incision kerr x one  Pre-operative Diagnosis: 39 week 2 day pregnancy.  Post-operative Diagnosis: same  Surgeon: Lenoard Aden   Assistants: Renae Fickle, cnm  Anesthesia: Local anesthesia 2% plain plain and Spinal anesthesia  ASA Class: 2  Procedure Details  The patient was seen in the Holding Room. The risks, benefits, complications, treatment options, and expected outcomes were discussed with the patient.  The patient concurred with the proposed plan, giving informed consent. The risks of anesthesia, infection, bleeding and possible injury to other organs discussed. Injury to bowel, bladder, or ureter with possible need for repair discussed. Possible need for transfusion with secondary risks of hepatitis or HIV acquisition discussed. Post operative complications to include but not limited to DVT, PE and Pneumonia noted. The site of surgery properly noted/marked. The patient was taken to Operating Room # B, identified as Madison Bowman and the procedure verified as C-Section Delivery. A Time Out was held and the above information confirmed.  After induction of anesthesia, the patient was draped and prepped in the usual sterile manner. A Pfannenstiel incision was made and carried down through the subcutaneous tissue to the fascia. Fascial incision was made and extended transversely using Mayo scissors. The fascia was separated from the underlying rectus tissue superiorly and inferiorly. The peritoneum was identified and entered. Peritoneal incision was extended longitudinally. The utero-vesical peritoneal reflection was incised transversely and the bladder flap was bluntly freed from the lower uterine segment. A low transverse uterine incision(Kerr hysterotomy) was made. Delivered from female presentation was a  OA with Apgar scores of 8 at one minute and 9 at five minutes. Bulb suctioning gently performed. Neonatal team in attendance.After  the umbilical cord was clamped and cut cord blood was obtained for evaluation. The placenta was removed intact and appeared normal. The uterus was curetted with a dry lap pack. Good hemostasis was noted.The uterine outline, tubes and ovaries appeared normal. The uterine incision was closed with running locked sutures of 0 Monocryl x 2 layers. Hemostasis was observed. Lavage was carried out until clear.The parietal peritoneum was closed with a running 2-0 Monocryl suture. The fascia was then reapproximated with running sutures of 0 Monocryl. The skin was reapproximated with 4-0 vicryl after [place] closure with 2-0 plain.  Instrument, sponge, and needle counts were correct prior the abdominal closure and at the conclusion of the case.   Findings: As noted  Estimated Blood Loss:  300 mL         Drains: foley                 Specimens: placenta                 Complications:  None; patient tolerated the procedure well.         Disposition: PACU - hemodynamically stable.         Condition: stable  Attending Attestation: I performed the procedure.

## 2020-01-08 NOTE — Anesthesia Procedure Notes (Signed)
Spinal  Patient location during procedure: OR Start time: 01/08/2020 11:50 AM End time: 01/08/2020 11:53 AM Staffing Performed: anesthesiologist  Anesthesiologist: Beryle Lathe, MD Preanesthetic Checklist Completed: patient identified, IV checked, risks and benefits discussed, surgical consent, monitors and equipment checked, pre-op evaluation and timeout performed Spinal Block Patient position: sitting Prep: DuraPrep Patient monitoring: heart rate, cardiac monitor, continuous pulse ox and blood pressure Approach: midline Location: L3-4 Injection technique: single-shot Needle Needle type: Pencan  Needle gauge: 24 G Additional Notes Consent was obtained prior to the procedure with all questions answered and concerns addressed. Risks including, but not limited to, bleeding, infection, nerve damage, paralysis, failed block, inadequate analgesia, allergic reaction, high spinal, itching, and headache were discussed and the patient wished to proceed. Functioning IV was confirmed and monitors were applied. Sterile prep and drape, including hand hygiene, mask, and sterile gloves were used. The patient was positioned and the spine was prepped. The skin was anesthetized with lidocaine. Free flow of clear CSF was obtained prior to injecting local anesthetic into the CSF. The spinal needle aspirated freely following injection. The needle was carefully withdrawn. The patient tolerated the procedure well.   Leslye Peer, MD

## 2020-01-08 NOTE — Anesthesia Postprocedure Evaluation (Signed)
Anesthesia Post Note  Patient: Madison Bowman  Procedure(s) Performed: Repeat CESAREAN SECTION (N/A )     Patient location during evaluation: PACU Anesthesia Type: Spinal Level of consciousness: awake and alert Pain management: pain level controlled Vital Signs Assessment: post-procedure vital signs reviewed and stable Respiratory status: spontaneous breathing and respiratory function stable Cardiovascular status: blood pressure returned to baseline and stable Postop Assessment: spinal receding and no apparent nausea or vomiting Anesthetic complications: no   No complications documented.  Last Vitals:  Vitals:   01/08/20 1330 01/08/20 1345  BP: 122/83 127/83  Pulse: 62 (!) 58  Resp: 14 14  Temp:    SpO2: 100% 100%    Last Pain:  Vitals:   01/08/20 1345  TempSrc:   PainSc: 0-No pain   Pain Goal:    LLE Motor Response: Purposeful movement (01/08/20 1330) LLE Sensation: No sensation (absent) (01/08/20 1330) RLE Motor Response: Purposeful movement (01/08/20 1330) RLE Sensation: No sensation (absent) (01/08/20 1330)        Beryle Lathe

## 2020-01-08 NOTE — Lactation Note (Addendum)
This note was copied from a baby's chart. Lactation Consultation Note  Infant is 8 hours old 39 weeks. Mom has GD diet controlled, smoker, anxiety. Zoloft L2 in [place] book. Mom states infant having trouble sustaining the latch. Last feeding attempt, 1317 for 7 minutes and 1800 but not successful. 3 urine noted in feeding chart. Dad stated he changed a stool diaper earlier today.   LC assisted Mom to get a deep latch on the Left breast for 20 minutes. Infant became very fussy. Mom not able to get a latch on her own since infant was crying when we switched to the opposite breast. Mom's nipples are compressible and infant continued to arch and push off the breast and on her own Mom struggled to sustain the latch. LC gave Mom a 24 NS and baby latched with ease for her and nursed for additional 20 minutes. Infant demonstrated signs of milk transfer shown to both parents during the feed.   Mom given breast shells to wear to help elongate her nipples. Mom taught hand expression to get the baby an appetizer before latching with snappies to collect the milk.  LC set up the DEBP and reviewed assembly, cleaning and milk storage with both parents.  Plan 1. Mom to feed infant based on cues 8-12x in 24 hour period latching the baby at the breast first and then using the nipple shield.          2. Parents are aware using a NS can effect milk supply and will use the DEBP to pump q 3 hours for 15 minutes.          3. Mom has the breast shells to wear while she is awake. LC reviewed how to use and assembly the breast shells.           4. Parents will call if they need assistance to the RN or LC for help with next feeding

## 2020-01-08 NOTE — Lactation Note (Signed)
This note was copied from a baby's chart. Lactation Consultation Note  Patient Name: Madison Bowman Date: 01/08/2020   Infant is 39 weeks 3 hours old. Mom is EBF. LC went in to see the Mom and RN as they were weighing the baby. Mom states she would like to be seen by lactation later in the day.  LC asked the RN, Laurence Spates, to put in an order for lactation in the babies chart.  LC to follow up later.

## 2020-01-08 NOTE — Transfer of Care (Signed)
Immediate Anesthesia Transfer of Care Note  Patient: Madison Bowman  Procedure(s) Performed: Repeat CESAREAN SECTION (N/A )  Patient Location: PACU  Anesthesia Type:Spinal  Level of Consciousness: awake, alert  and oriented  Airway & Oxygen Therapy: Patient Spontanous Breathing  Post-op Assessment: Report given to RN and Post -op Vital signs reviewed and stable  Post vital signs: Reviewed and stable  Last Vitals:  Vitals Value Taken Time  BP 115/70 01/08/20 1400  Temp    Pulse 54 01/08/20 1407  Resp 9 01/08/20 1407  SpO2 100 % 01/08/20 1407  Vitals shown include unvalidated device data.  Last Pain:  Vitals:   01/08/20 1345  TempSrc:   PainSc: 0-No pain         Complications: No complications documented.

## 2020-01-08 NOTE — Anesthesia Preprocedure Evaluation (Addendum)
Anesthesia Evaluation  Patient identified by MRN, date of birth, ID band Patient awake    Reviewed: Allergy & Precautions, NPO status , Patient's Chart, lab work & pertinent test results  History of Anesthesia Complications Negative for: history of anesthetic complications  Airway Mallampati: II  TM Distance: >3 FB Neck ROM: Full    Dental  (+) Dental Advisory Given, Teeth Intact   Pulmonary Current Smoker and Patient abstained from smoking.,    Pulmonary exam normal        Cardiovascular negative cardio ROS Normal cardiovascular exam     Neuro/Psych PSYCHIATRIC DISORDERS Anxiety negative neurological ROS     GI/Hepatic Neg liver ROS, GERD  Medicated and Controlled,  Endo/Other  negative endocrine ROS  Renal/GU negative Renal ROS     Musculoskeletal negative musculoskeletal ROS (+)   Abdominal   Peds  Hematology  (+) anemia ,  Plt 283k    Anesthesia Other Findings Covid test negative   Reproductive/Obstetrics (+) Pregnancy                            Anesthesia Physical Anesthesia Plan  ASA: II  Anesthesia Plan: Spinal   Post-op Pain Management:    Induction:   PONV Risk Score and Plan: 2 and Treatment may vary due to age or medical condition, Ondansetron and Scopolamine patch - Pre-op  Airway Management Planned: Natural Airway  Additional Equipment: None  Intra-op Plan:   Post-operative Plan:   Informed Consent: I have reviewed the patients History and Physical, chart, labs and discussed the procedure including the risks, benefits and alternatives for the proposed anesthesia with the patient or authorized representative who has indicated his/her understanding and acceptance.       Plan Discussed with: CRNA and Anesthesiologist  Anesthesia Plan Comments: (Labs reviewed, platelets acceptable. Discussed risks and benefits of spinal, including spinal/epidural hematoma,  infection, failed block, and PDPH. Patient expressed understanding and wished to proceed. )       Anesthesia Quick Evaluation

## 2020-01-08 NOTE — H&P (Signed)
Madison Bowman is a 34 y.o. female presenting for rpt csection. OB History    Gravida  5   Para  1   Term  1   Preterm  0   AB  3   Living  0     SAB  2   TAB  1   Ectopic  0   Multiple  0   Live Births           Obstetric Comments  Patient had TAB with Cytotec at age 43 years and her family/husband is unaware of this history        Past Medical History:  Diagnosis Date  . Anxiety   . Vaginal Pap smear, abnormal    high risk HPV   Past Surgical History:  Procedure Laterality Date  . CESAREAN SECTION N/A 12/18/2015   Procedure: CESAREAN SECTION;  Surgeon: Levie Heritage, DO;  Location: [facility] BIRTHING SUITES;  Service: Obstetrics;  Laterality: N/A;  . INDUCED ABORTION    . NO PAST SURGERIES     Family History: family history includes Depression in her maternal grandfather; Diabetes in her father; Heart disease in her paternal grandfather; Hypertension in her father; Thyroid disease in her mother. Social History:  reports that she has been smoking cigarettes. She has been smoking about 1.00 pack per day. She has never used smokeless tobacco. She reports that she does not drink alcohol and does not use drugs.     Maternal Diabetes: No Genetic Screening: Normal Maternal Ultrasounds/Referrals: Normal Fetal Ultrasounds or other Referrals:  None Maternal Substance Abuse:  No Significant Maternal Medications:  None Significant Maternal Lab Results:  Group B Strep negative Other Comments:  None  Review of Systems  Constitutional: Negative.   All other systems reviewed and are negative.  Maternal Medical History:  Fetal activity: Perceived fetal activity is normal.   Last perceived fetal movement was within the past hour.    Prenatal complications: no prenatal complications Prenatal Complications - Diabetes: none.      Blood pressure 116/86, pulse 85, temperature 98.4 F (36.9 C), temperature source Oral, resp. rate 18, unknown if currently  breastfeeding. Maternal Exam:  Uterine Assessment: Contraction strength is mild.  Contraction frequency is irregular.   Abdomen: Patient reports no abdominal tenderness. Surgical scars: low transverse.   Fetal presentation: vertex  Introitus: Normal vulva. Normal vagina.  Ferning test: not done.  Nitrazine test: not done. Amniotic fluid character: not assessed.  Pelvis: questionable for delivery.   Cervix: Cervix evaluated by digital exam.     Physical Exam Genitourinary:    General: Normal vulva.     Prenatal labs: ABO, Rh: --/--/A POS (09/09 0827) Antibody: NEG (09/09 0827) Rubella: Immune (03/18 0000) RPR: NON REACTIVE (09/09 0827)  HBsAg: Negative (03/18 0000)  HIV: Non-reactive (03/18 0000)  GBS:   neg  Assessment/Plan: 39wks IUP Previous csection Rpt csection  Risks vs benefits discussed Consent done   Mishon Blubaugh J 01/08/2020, 10:39 AM

## 2020-01-09 DIAGNOSIS — Z98891 History of uterine scar from previous surgery: Secondary | ICD-10-CM

## 2020-01-09 DIAGNOSIS — D62 Acute posthemorrhagic anemia: Secondary | ICD-10-CM | POA: Diagnosis not present

## 2020-01-09 LAB — CBC
HCT: 29.2 % — ABNORMAL LOW (ref 36.0–46.0)
Hemoglobin: 9.6 g/dL — ABNORMAL LOW (ref 12.0–15.0)
MCH: 27.4 pg (ref 26.0–34.0)
MCHC: 32.9 g/dL (ref 30.0–36.0)
MCV: 83.2 fL (ref 80.0–100.0)
Platelets: 215 10*3/uL (ref 150–400)
RBC: 3.51 MIL/uL — ABNORMAL LOW (ref 3.87–5.11)
RDW: 13.7 % (ref 11.5–15.5)
WBC: 19.2 10*3/uL — ABNORMAL HIGH (ref 4.0–10.5)
nRBC: 0 % (ref 0.0–0.2)

## 2020-01-09 MED ORDER — MAGNESIUM OXIDE 400 (241.3 MG) MG PO TABS
400.0000 mg | ORAL_TABLET | Freq: Every day | ORAL | Status: DC
  Administered 2020-01-09 – 2020-01-10 (×2): 400 mg via ORAL
  Filled 2020-01-09 (×2): qty 1

## 2020-01-09 MED ORDER — POLYSACCHARIDE IRON COMPLEX 150 MG PO CAPS
150.0000 mg | ORAL_CAPSULE | Freq: Every day | ORAL | Status: DC
  Administered 2020-01-09 – 2020-01-10 (×2): 150 mg via ORAL
  Filled 2020-01-09 (×2): qty 1

## 2020-01-09 NOTE — Progress Notes (Signed)
CSW received consult for hx of Anxiety.  CSW met with MOB at bedside to offer support and complete assessment. On arrival, CSW introduced self and stated visit purpose. Maternal grandmother was present, however, after PPD/A and SIDS education, she stepped out of room to offer MOB privacy during assessment. MOB and MGM were very pleasant and engaged during visit.   CSW provided education regarding the baby blues period vs. perinatal mood disorders, discussed treatment and gave resources for mental health follow up if concerns arise.  CSW recommends self-evaluation during the postpartum time period using the New Mom Checklist from Postpartum Progress and encouraged MOB and MGM to contact a medical professional if symptoms are noted at any time.  MOB and MGM stated understanding and denied any questions. MOB reported four month postpartum hx after birth of first child. MOB related sx to being first time mother. MOB stated sx decreased once she "got the hang of it".  CSW provided review of Sudden Infant Death Syndrome (SIDS) precautions. MOB and MGM stated understanding and denied any questions. MOB confirmed having all needed items including car seat and bassinet for baby's safe sleep.   During assessment, MOB confirmed history of Generalized anxiety disorder(GAD). MOB reports dx since February of 2007. MOB identified sx as "rapid heartbeat and irrational thinking". MOB states sx have been managed with Rx treatment Zoloft 200 mg and PRN Xanax. MOB denies any SI, HI, or domestic violence. MOB identified coping skills as positive self talk and PRN Xanax. MOB stated she tried counseling but did not have a favorable experience. CSW encouraged MOB to reconsider counseling coupled with Rx for best outcomes. CSW provided MOB with options to reached counseling sources. MOB stated she may reconsider if needed. MOB stated current mood as "chill and totally fine" and stated this time around is different due to experience  and support. MOB declined any additional support or resources at this time.     CSW identifies no further need for intervention and no barriers to discharge at this time.  Kevaughn Ewing D. Jibran Crookshanks, MSW, LCSW Clinical Social Worker [phone] 

## 2020-01-09 NOTE — Lactation Note (Signed)
This note was copied from a baby's chart. Lactation Consultation Note  Patient Name: Madison Bowman OMBTD'H Date: 01/09/2020    Infant is 25 hours old 39 weeks with a 7% weight loss. Mom has been able to get infant to latch using a 24 NS feeding based on cues 15-30 minutes. Infant has 9 wets and 3 stools since birth. Mom has been pumping with either manual or DEBP for at least 15 minutes but not consistently. LC increased flange size to 27.     Mom states her last feeding before infant went for circumcision was at 11 am without the nipple shield on the left breast for 15 minutes.   Mom denies any pain with the latch or with the flange during pumping. Mom will get coconut oil from the RN to line the flange before pumping. Mom was able to express with the pump 33ml given to infant with spoon and using a slow flow nipple with paced bottle feeding.  Plan 1. Mom to continue feed based on cues 8-12 x in 24 hrs, first at the breast and if needed with the 24 NS.          2. Mom to pump q 3 hours for 15 minutes to give extra calories and stimulate breast since the NS is a barrier can affect milk supply.          3. Mom to offer EBM with spoon or slow flow nipple with paced bottle feeding based on breastfeeding supplementation guideline.          4. Mom to f/u with Pediatrician upon discharge to check infant weight.

## 2020-01-10 ENCOUNTER — Encounter (HOSPITAL_COMMUNITY): Payer: Self-pay | Admitting: Obstetrics and Gynecology

## 2020-01-10 DIAGNOSIS — O165 Unspecified maternal hypertension, complicating the puerperium: Secondary | ICD-10-CM | POA: Diagnosis not present

## 2020-01-10 LAB — COMPREHENSIVE METABOLIC PANEL
ALT: 21 U/L (ref 0–44)
AST: 29 U/L (ref 15–41)
Albumin: 2.2 g/dL — ABNORMAL LOW (ref 3.5–5.0)
Alkaline Phosphatase: 139 U/L — ABNORMAL HIGH (ref 38–126)
Anion gap: 9 (ref 5–15)
BUN: 6 mg/dL (ref 6–20)
CO2: 24 mmol/L (ref 22–32)
Calcium: 8.5 mg/dL — ABNORMAL LOW (ref 8.9–10.3)
Chloride: 103 mmol/L (ref 98–111)
Creatinine, Ser: 0.71 mg/dL (ref 0.44–1.00)
GFR calc Af Amer: >60 mL/min (ref >60)
GFR calc non Af Amer: >60 mL/min (ref >60)
Glucose, Bld: 122 mg/dL — ABNORMAL HIGH (ref 70–99)
Potassium: 3.6 mmol/L (ref 3.5–5.1)
Sodium: 136 mmol/L (ref 135–145)
Total Bilirubin: 0.3 mg/dL (ref 0.3–1.2)
Total Protein: 5.8 g/dL — ABNORMAL LOW (ref 6.5–8.1)

## 2020-01-10 LAB — CBC
HCT: 30.7 % — ABNORMAL LOW (ref 36.0–46.0)
Hemoglobin: 9.8 g/dL — ABNORMAL LOW (ref 12.0–15.0)
MCH: 26.7 pg (ref 26.0–34.0)
MCHC: 31.9 g/dL (ref 30.0–36.0)
MCV: 83.7 fL (ref 80.0–100.0)
Platelets: 275 10*3/uL (ref 150–400)
RBC: 3.67 MIL/uL — ABNORMAL LOW (ref 3.87–5.11)
RDW: 13.8 % (ref 11.5–15.5)
WBC: 15.7 10*3/uL — ABNORMAL HIGH (ref 4.0–10.5)
nRBC: 0 % (ref 0.0–0.2)

## 2020-01-10 MED ORDER — POLYSACCHARIDE IRON COMPLEX 150 MG PO CAPS: 150.0000 mg | ORAL_CAPSULE | Freq: Every day | ORAL | Status: AC

## 2020-01-10 MED ORDER — MAGNESIUM OXIDE -MG SUPPLEMENT 400 (240 MG) MG PO TABS: 400.0000 mg | ORAL_TABLET | Freq: Every day | ORAL | Status: AC

## 2020-01-10 MED ORDER — ACETAMINOPHEN 500 MG PO TABS: 1000.0000 mg | ORAL_TABLET | Freq: Four times a day (QID) | ORAL | 2 refills | Status: AC | PRN

## 2020-01-10 MED ORDER — NIFEDIPINE ER OSMOTIC RELEASE 30 MG PO TB24
30.0000 mg | ORAL_TABLET | Freq: Every day | ORAL | Status: DC
  Administered 2020-01-10: 30 mg via ORAL
  Filled 2020-01-10: qty 1

## 2020-01-10 MED ORDER — COCONUT OIL OIL: 1.0000 "application " | TOPICAL_OIL | 0 refills | Status: AC | PRN

## 2020-01-10 MED ORDER — IBUPROFEN 800 MG PO TABS: 800.0000 mg | ORAL_TABLET | Freq: Four times a day (QID) | ORAL | 0 refills | Status: AC

## 2020-01-10 MED ORDER — SENNOSIDES-DOCUSATE SODIUM 8.6-50 MG PO TABS: 2.0000 | ORAL_TABLET | ORAL | Status: DC

## 2020-01-10 MED ORDER — NIFEDIPINE ER 30 MG PO TB24: 30.0000 mg | ORAL_TABLET | Freq: Every day | ORAL | 1 refills | Status: AC

## 2020-01-10 MED ORDER — SIMETHICONE 80 MG PO CHEW: 80.0000 mg | CHEWABLE_TABLET | ORAL | 0 refills | Status: AC | PRN

## 2020-01-10 MED ORDER — OXYCODONE HCL 5 MG PO TABS
5.0000 mg | ORAL_TABLET | Freq: Three times a day (TID) | ORAL | 0 refills | Status: DC | PRN
Start: 2020-01-10 — End: 2020-02-08

## 2020-01-10 MED ORDER — BUSPIRONE HCL 10 MG PO TABS
10.0000 mg | ORAL_TABLET | Freq: Two times a day (BID) | ORAL | Status: DC
  Administered 2020-01-10: 10 mg via ORAL
  Filled 2020-01-10 (×2): qty 1

## 2020-01-10 MED ORDER — PRENATAL MULTIVITAMIN CH: 1.0000 | ORAL_TABLET | Freq: Every day | ORAL | Status: DC

## 2020-01-10 NOTE — Discharge Instructions (Signed)
Lactation outpatient support - home visit  Linnell Fulling RN, MHA, IBCLC at [employer]: Lactation Consultant  [email or web address]    Additional resources:  [facility] [email or web address]

## 2020-01-10 NOTE — Lactation Note (Signed)
This note was copied from a baby's chart. Lactation Consultation Note  Patient Name: Madison Bowman TWSFK'C Date: 01/10/2020 Reason for consult: Follow-up assessment;1st time breastfeeding;Infant weight loss;Term;Maternal endocrine disorder Type of Endocrine Disorder?: Diabetes GDM-diet controlled C/S  LC in to visit with P2 Mom of term baby at 60 hrs old.  Baby is at 9.8% weight loss today with 3 voids and 2 stools last 24 hrs.  Baby breast fed 10 times last 24 hrs (using nipple shield).  Mom has not pumped in last day as baby was on the breast so often.  Mom states baby has often been sleepy on the breast, needing stimulation to continue feeding.  Mom denies breast fullness this am.  Mom trying to latch swaddled baby in cradle hold while sitting in recliner.  Mom holding baby to her breast without any pillow support.  Nipple shield properly placed and baby fussy popping on and off as Mom pinching breast, but not supporting her breast.  Offered to assist with positioning and latching.  Removed baby's swaddle.  Placed pillow support under baby to allow baby to be at height of breast.  Asked Mom if she had tried to latch baby without nipple shield and she said no.  Suggested we tried.  Mom taught to use cross cradle hold to better support her breast and control baby's latch to breast.  Hand expressed a good flow of colostrum easily.  Baby not opening his mouth widely, reminding Mom to wait until baby opens his mouth wide before bringing him quickly to breast.  When he did, he latched deeply onto breast.  Reviewed importance of keeping baby's body in close and his neck straight.  Baby's lips flanged well.  Deep jaw extensions were noted, with pauses and swallowing identified.  Demonstrated how to use alternate breast compression to increase milk transfer.  Mom encouraged to relax her body to encourage an easier milk flow from her breasts.   Plan- 1- keep baby STS as much as possible 2- Offer breast  with cues, making sure baby is latched deeply to breast, feeling a tug.  Use good support under baby and support on breast, compressing during sucking bursts. 3- Pump both breasts 15 mins after breastfeeding and feed baby any EBM by slow flow bottle.  4-Follow-up with OP lactation (message sent to clinic for consult) 5- Mom to call prn.  Mom aware of OP lactation support available to her.    Feeding Feeding Type: Breast Fed  LATCH Score Latch: Grasps breast easily, tongue down, lips flanged, rhythmical sucking.  Audible Swallowing: Spontaneous and intermittent  Type of Nipple: Everted at rest and after stimulation  Comfort (Breast/Nipple): Soft / non-tender  Hold (Positioning): Assistance needed to correctly position infant at breast and maintain latch.  LATCH Score: 9  Interventions Interventions: Breast feeding basics reviewed;Assisted with latch;Skin to skin;Breast massage;Hand express;Breast compression;Adjust position;Support pillows;Position options;Expressed milk;DEBP  Lactation Tools Discussed/Used Tools: Pump;Coconut oil Breast pump type: Double-Electric Breast Pump   Consult Status Consult Status: Complete Date: 01/10/20 Follow-up type: Out-patient    Judee Clara 01/10/2020, 8:44 AM

## 2020-01-10 NOTE — Discharge Summary (Signed)
OB Discharge Summary  Patient Name: Madison Bowman DOB: 1985/10/02 MRN: [id number]  Date of admission: 01/08/2020 Delivering provider: Olivia Mackie   Admitting diagnosis: Previous cesarean delivery affecting pregnancy [O34.219] Intrauterine pregnancy: 110w1d     Secondary diagnosis: Patient Active Problem List   Diagnosis Date Noted   Postpartum hypertension 01/10/2020   Status post repeat low transverse cesarean section 9/11 01/09/2020   Postpartum care following cesarean delivery 9/11 01/09/2020   Anemia due to acute blood loss 01/09/2020   Previous cesarean delivery affecting pregnancy 01/08/2020   Generalized anxiety disorder 06/27/2017   Nicotine dependence with current use 07/12/2015   Additional problems:none   Date of discharge: 01/10/2020   Discharge diagnosis: Principal Problem:   Postpartum care following cesarean delivery 9/11 Active Problems:   Nicotine dependence with current use   Generalized anxiety disorder   Previous cesarean delivery affecting pregnancy   Status post repeat low transverse cesarean section 9/11   Anemia due to acute blood loss   Postpartum hypertension                                                              Post partum procedures:none  Augmentation: N/A Pain control: Spinal  Laceration:None  Episiotomy:None  Complications: None  Hospital course:  Sceduled C/S   34 y.o. yo [id number] at [redacted]w[redacted]d was admitted to the hospital 01/08/2020 for scheduled cesarean section with the following indication:Elective Repeat.Delivery details are as follows:  Membrane Rupture Time/Date: 12:12 PM ,01/08/2020   Delivery Method:C-Section, Low Transverse  Details of operation can be found in separate operative note.  Patient had a postpartum course complicated by elevated blood pressure. Preeclampsia labs on postpartum day 2 were benign. Patient was started on Procardia 30 XL. She denies any neural symptoms. Patient has high anxiety and strongly desires  discharge to home.  She is ambulating, tolerating a regular diet, passing flatus, and urinating well. Patient is discharged home in stable condition on  01/10/20  Plan of care in consult with Dr. Billy Coast. Patient is for close follow up in office in 4 days. Preeclampsia precautions reviewed and patient encouraged rest when baby is sleeping.         Newborn Data: Birth date:01/08/2020  Birth time:12:12 PM  Gender:Female  Living status:Living  Apgars:7 ,6  Weight:3255 g     Physical exam  Vitals:   01/10/20 0757 01/10/20 0948 01/10/20 1209 01/10/20 1603  BP: (!) 155/84 (!) 155/85 (!) 154/81 137/82  Pulse: 71 73 87 94  Resp: 18  18 18   Temp: 97.6 F (36.4 C)  98.2 F (36.8 C) 98.5 F (36.9 C)  TempSrc: Oral  Oral Oral  SpO2: 100%  100%    General: alert, cooperative and no distress Lochia: appropriate Uterine Fundus: firm Incision: Dressing is clean, dry, and intact DVT Evaluation: No cords or calf tenderness. No significant calf/ankle edema. Labs: Lab Results  Component Value Date   WBC 15.7 (H) 01/10/2020   HGB 9.8 (L) 01/10/2020   HCT 30.7 (L) 01/10/2020   MCV 83.7 01/10/2020   PLT 275 01/10/2020   CMP Latest Ref Rng & Units 01/10/2020  Glucose 70 - 99 mg/dL 01/12/2020)  BUN 6 - 20 mg/dL 6  Creatinine 419(F - 7.90 mg/dL 2.40  Sodium 9.73 - 532 mmol/L 136  Potassium 3.5 - 5.1 mmol/L 3.6  Chloride 98 - 111 mmol/L 103  CO2 22 - 32 mmol/L 24  Calcium 8.9 - 10.3 mg/dL 6.2(I)  Total Protein 6.5 - 8.1 g/dL 2.9(N)  Total Bilirubin 0.3 - 1.2 mg/dL 0.3  Alkaline Phos 38 - 126 U/L 139(H)  AST 15 - 41 U/L 29  ALT 0 - 44 U/L 21  Vaccines: TDaP declined         Flu  declined  Discharge instruction:  per After Visit Summary,  [facility] OB booklet and  "Understanding Mother & Baby Care" hospital booklet  After Visit Meds:  Allergies as of 01/10/2020      Reactions   Penicillins Rash   Has patient had a PCN reaction causing immediate rash, facial/tongue/throat swelling, SOB or  lightheadedness with hypotension: Yes Has patient had a PCN reaction causing severe rash involving mucus membranes or skin necrosis: Yes Has patient had a PCN reaction that required hospitalization No Has patient had a PCN reaction occurring within the last 10 years: No If all of the above answers are "NO", then may proceed with Cephalosporin use.      Medication List    STOP taking these medications   ALPRAZolam 0.5 MG tablet Commonly known as: XANAX   amphetamine-dextroamphetamine 5 MG tablet Commonly known as: Adderall     TAKE these medications   acetaminophen 500 MG tablet Commonly known as: TYLENOL Take 2 tablets (1,000 mg total) by mouth every 6 (six) hours as needed.   busPIRone 5 MG tablet Commonly known as: BUSPAR TAKE 1 TABLET BY MOUTH TWICE A DAY What changed:   when to take this  reasons to take this   coconut oil Oil Apply 1 application topically as needed.   ibuprofen 800 MG tablet Commonly known as: ADVIL Take 1 tablet (800 mg total) by mouth every 6 (six) hours.   iron polysaccharides 150 MG capsule Commonly known as: Ferrex 150 Take 1 capsule (150 mg total) by mouth daily.   Magnesium Oxide 400 (240 Mg) MG Tabs Take 1 tablet (400 mg total) by mouth daily. For prevention of constipation.   NIFEdipine 30 MG 24 hr tablet Commonly known as: ADALAT CC Take 1 tablet (30 mg total) by mouth daily.   oxyCODONE 5 MG immediate release tablet Commonly known as: Roxicodone Take 1 tablet (5 mg total) by mouth every 8 (eight) hours as needed.   prenatal multivitamin Tabs tablet Take 1 tablet by mouth daily at 12 noon. Start taking on: January 11, 2020   senna-docusate 8.6-50 MG tablet Commonly known as: Senokot-S Take 2 tablets by mouth daily. Start taking on: January 11, 2020   sertraline 100 MG tablet Commonly known as: ZOLOFT Take 200 mg by mouth at bedtime.   simethicone 80 MG chewable tablet Commonly known as: MYLICON Chew 1 tablet (80  mg total) by mouth as needed for flatulence.       Diet: routine diet  Activity: Advance as tolerated. Pelvic rest for 6 weeks.   Postpartum contraception: Not Discussed  Newborn Data: Live born female  Birth Weight: 7 lb 2.8 oz (3255 g) APGAR: 7, 6  Newborn Delivery   Birth date/time: 01/08/2020 12:12:00 Delivery type: C-Section, Low Transverse Trial of labor: No C-section categorization: Repeat      named Maverick Baby Feeding: Bottle and Breast Disposition:home with mother   Delivery Report:  Review the Delivery Report for details.    Follow up:  Follow-up Information    Olivia Mackie,  MD. Schedule an appointment as soon as possible for a visit on 01/14/2020.   Specialty: Obstetrics and Gynecology Contact information: [address] [place] [address] [phone]                 Signed: Cipriano Mile, MSN 01/10/2020, 4:52 PM

## 2020-01-10 NOTE — Progress Notes (Signed)
Pt discharged after discharge instructions given. All questions answered. IV discontinued. Pt discharged in stable condition.

## 2020-01-10 NOTE — Progress Notes (Signed)
Subjective: POD# 2 Live born female  Birth Weight: 7 lb 2.8 oz (3255 g) APGAR: 7, 6  Newborn Delivery   Birth date/time: 01/08/2020 12:12:00 Delivery type: C-Section, Low Transverse Trial of labor: No C-section categorization: Repeat     Baby name: Maverick Delivering provider: Olivia Mackie   circumcision completed Feeding: breast and bottle  Pain control at delivery: Spinal   Reports feeling very anxious, wants to leave. RN reports patient requesting cigarettes. Offered nicotine patch and declines.   Patient reports tolerating PO.   Breast symptoms:none Pain controlled with PO meds Denies HA/SOB/C/P/N/V/dizziness. Flatus present, + BM. She reports vaginal bleeding as normal, without clots.  She is ambulating, urinating without difficulty.     Objective:   VS:    Vitals:   01/09/20 1517 01/09/20 2031 01/10/20 0514 01/10/20 0757  BP: (!) 105/59 109/65 (!) 148/83 (!) 155/84  Pulse: 60 63 72 71  Resp:  19 17 18   Temp: 98.1 F (36.7 C)  98.1 F (36.7 C) 97.6 F (36.4 C)  TempSrc: Oral  Oral Oral  SpO2: 99% 98% 100% 100%      Intake/Output Summary (Last 24 hours) at 01/10/2020 0933 Last data filed at 01/09/2020 1046 Gross per 24 hour  Intake --  Output 900 ml  Net -900 ml        Recent Labs    01/09/20 0301  WBC 19.2*  HGB 9.6*  HCT 29.2*  PLT 215     Blood type: --/--/A POS (09/09 0827)  Rubella: Immune (03/18 0000)  Vaccines: TDaP declined         Flu    declined   Physical Exam:  General: alert, cooperative and no distress Abdomen: soft, nontender, normal bowel sounds Incision: clean, dry and intact Uterine Fundus: firm, below umbilicus, nontender Lochia: minimal Ext: no edema, redness or tenderness in the calves or thighs      Assessment/Plan: 34 y.o.   POD# 2. 20                 Principal Problem:   Postpartum care following cesarean delivery 9/11 Active Problems:   Nicotine dependence with current use   Generalized anxiety  disorder  - on Zoloft 200 mg daily, has Buspar PRN but prefers Xanax  - advised use BuSpar, can increase dose to 10 mg BID as needed, Xanax not advised w/ breatfeeding   Previous cesarean delivery affecting pregnancy   Status post repeat low transverse cesarean section 9/11   Anemia due to acute blood loss  - asymptomatic  - started oral Fe and Mag Ox   Postpartum hypertension  - mild elevated BP, unclear etiology - onset of PP HTN vs anxiety exacerbation.   - will check PEC labs today, repeat BP q 4 hrs apart  - encouraged mindfulness and breathing exercises, use anti-anxiety medication as prescribed.   Will reassess status when result of labs and BP checks completed and if wnl will consider DC home with close F/U in office this week.  Routine post-op care  11/11, CNM, MSN 01/10/2020, 9:33 AM

## 2020-02-08 ENCOUNTER — Encounter: Payer: Self-pay | Admitting: Family Medicine

## 2020-02-08 ENCOUNTER — Telehealth (INDEPENDENT_AMBULATORY_CARE_PROVIDER_SITE_OTHER): Payer: Medicaid Other | Admitting: Family Medicine

## 2020-02-08 DIAGNOSIS — R059 Cough, unspecified: Secondary | ICD-10-CM

## 2020-02-08 DIAGNOSIS — R0981 Nasal congestion: Secondary | ICD-10-CM

## 2020-02-08 MED ORDER — AZITHROMYCIN 250 MG PO TABS: ORAL_TABLET | ORAL | 0 refills | Status: AC

## 2020-02-08 NOTE — Patient Instructions (Addendum)
  Nasal saline twice daily.  Warm tea with honey, lemon.  Plenty of fluids.   If not improving can take the antibiotic (azithromycin). Please let pediatrician know if you need to start the antibiotic.   -stay home while sick, and if you have COVID19 please stay home for a full 10 days since the onset of symptoms PLUS one day of no fever and feeling better  -[facility] COVID19 testing information: [email or web address] OR [phone]  -I sent the medication(s) we discussed to your pharmacy: Meds ordered this encounter  Medications  . azithromycin (ZITHROMAX) 250 MG tablet    Sig: 2 tabs day 1, then one tab daily    Dispense:  6 tablet    Refill:  0    -can use tylenol fevers, aches and pains per instructions  -can use nasal saline a few times per day if nasal congestion  -stay hydrated, drink plenty of fluids and eat small healthy meals - avoid dairy  -can take 1000 IU Vit D3 and Vit C 500 mg daily lozenges per instructions  -follow up with your doctor in 2-3 days unless improving and feeling better  I hope you are feeling better soon! Seek in-person care or a follow up telemedicine visit promptly if your symptoms worsen, new concerns arise or you are not improving as expected. Call 911 if severe symptoms.

## 2020-02-08 NOTE — Progress Notes (Signed)
Runny, sore throat, ear ringing and congestion Sx x 1 week.   OTC meds have not been tried because pt is breast feeding.

## 2020-02-08 NOTE — Progress Notes (Signed)
Virtual Visit via Video Note  I connected with Madison Bowman  on 02/08/20 at 11:40 AM EDT by a video enabled telemedicine application and verified that I am speaking with the correct person using two identifiers.  Location patient: home, [place] Location provider:work or home office Persons participating in the virtual visit: patient, provider  I discussed the limitations of evaluation and management by telemedicine and the availability of in person appointments. The patient expressed understanding and agreed to proceed.   HPI:  Acute telemedicine visit for : -Onset:started about 12-14 days ago -Symptoms include: sore throat, then nasal congestion, cough, HA,  now persistent cough with lots of mucus, L ear pressure, feels clogged, sinus discomfort, thick sinus discharge -Denies: fevers, SOB, CP, NVD, CP, HA, body aches -no known sick contacts - but daughter goes to daycare -Has tried: no medications as she is breast feeding -Pertinent past medical history:see below -Pertinent medication allergies: penicillins, immediate severe rash -COVID-19 vaccine status: not vaccinated for covid19  ROS: See pertinent positives and negatives per HPI.  Past Medical History:  Diagnosis Date  . Anxiety   . Vaginal Pap smear, abnormal    high risk HPV    Past Surgical History:  Procedure Laterality Date  . CESAREAN SECTION N/A 12/18/2015   Procedure: CESAREAN SECTION;  Surgeon: Levie Heritage, DO;  Location: [facility] BIRTHING SUITES;  Service: Obstetrics;  Laterality: N/A;  . CESAREAN SECTION N/A 01/08/2020   Procedure: Repeat CESAREAN SECTION;  Surgeon: Olivia Mackie, MD;  Location: [facility] LD ORS;  Service: Obstetrics;  Laterality: N/A;  EDD: 01/14/20  . INDUCED ABORTION    . NO PAST SURGERIES       Current Outpatient Medications:  .  acetaminophen (TYLENOL) 500 MG tablet, Take 2 tablets (1,000 mg total) by mouth every 6 (six) hours as needed., Disp: 100 tablet, Rfl: 2 .  ibuprofen (ADVIL) 800 MG tablet, Take 1  tablet (800 mg total) by mouth every 6 (six) hours., Disp: 30 tablet, Rfl: 0 .  NIFEdipine (ADALAT CC) 30 MG 24 hr tablet, Take 1 tablet (30 mg total) by mouth daily., Disp: 30 tablet, Rfl: 1 .  sertraline (ZOLOFT) 100 MG tablet, Take 200 mg by mouth at bedtime. , Disp: , Rfl:  .  azithromycin (ZITHROMAX) 250 MG tablet, 2 tabs day 1, then one tab daily, Disp: 6 tablet, Rfl: 0 .  coconut oil OIL, Apply 1 application topically as needed. (Patient not taking: Reported on 02/08/2020), Disp: , Rfl: 0 .  iron polysaccharides (FERREX 150) 150 MG capsule, Take 1 capsule (150 mg total) by mouth daily. (Patient not taking: Reported on 02/08/2020), Disp: , Rfl:  .  Magnesium Oxide 400 (240 Mg) MG TABS, Take 1 tablet (400 mg total) by mouth daily. For prevention of constipation. (Patient not taking: Reported on 02/08/2020), Disp: 30 tablet, Rfl:  .  simethicone (MYLICON) 80 MG chewable tablet, Chew 1 tablet (80 mg total) by mouth as needed for flatulence., Disp: 30 tablet, Rfl: 0  EXAM:  VITALS per patient if applicable:  GENERAL: alert, oriented, appears well and in no acute distress  HEENT: atraumatic, conjunttiva clear, no obvious abnormalities on inspection of external nose and ears  NECK: normal movements of the head and neck  LUNGS: on inspection no signs of respiratory distress, breathing rate appears normal, no obvious gross SOB, gasping or wheezing  CV: no obvious cyanosis  MS: moves all visible extremities without noticeable abnormality  PSYCH/NEURO: pleasant and cooperative, no obvious depression or anxiety, speech and  thought processing grossly intact  ASSESSMENT AND PLAN:  Discussed the following assessment and plan:  Cough  Nasal sinus congestion  -we discussed possible serious and likely etiologies, options for evaluation and workup, limitations of telemedicine visit vs in person visit, treatment, treatment risks and precautions. Pt prefers to treat via telemedicine empirically  rather than in person at this moment.  Patient with prolonged upper respiratory symptoms.  Query resolving viral infection, given some improvement and she reports the sinus discomfort has resolved and now mostly has a cough.  Versus, possible secondary bacterial infection given the ear discomfort and the discolored mucus.  She opted to try nasal saline twice daily, fluids, vitamin C MD, warm tea with honey and lemon and an antibiotic if not improving.  After discussion of various options and risk in terms of lactation, etc., she opted for azithromycin 500 mg the first day, followed by full 250 mg for 4 days.  Advised that she speak with her pediatrician if further concerns about taking this in terms of lactation.  Also advised Covid testing and discussed options.  Advised staying home all sick unless to seek medical care. Work/School slipped offered: declined Scheduled follow up with PCP offered: Agrees to follow-up if worsening or not improving. Advised to seek prompt follow up telemedicine visit or in person care if worsening, new symptoms arise, or if is not improving with treatment. Did let this patient know that I only do telemedicine on Tuesdays and Thursdays for [place]. Advised to schedule follow up visit with PCP or [facility] if any further questions or concerns to avoid delays in care.   I discussed the assessment and treatment plan with the patient. The patient was provided an opportunity to ask questions and all were answered. The patient agreed with the plan and demonstrated an understanding of the instructions.     Terressa Koyanagi, DO

## 2022-01-21 ENCOUNTER — Encounter: Payer: Self-pay | Admitting: *Deleted
